# Patient Record
Sex: Female | Born: 1965
Health system: Southern US, Community
[De-identification: ages and names within clinical notes are randomized; demographics above are authoritative.]

## PROBLEM LIST (undated history)

## (undated) DIAGNOSIS — C73 Malignant neoplasm of thyroid gland: Secondary | ICD-10-CM

## (undated) HISTORY — PX: TONSILLECTOMY: SUR1361

## (undated) HISTORY — PX: ABDOMINAL HYSTERECTOMY: SHX81

## (undated) HISTORY — PX: THYROID SURGERY: SHX805

## (undated) HISTORY — PX: CHOLECYSTECTOMY: SHX55

---

## 2016-04-21 DIAGNOSIS — Z6834 Body mass index (BMI) 34.0-34.9, adult: Secondary | ICD-10-CM | POA: Diagnosis not present

## 2016-04-21 DIAGNOSIS — E041 Nontoxic single thyroid nodule: Secondary | ICD-10-CM | POA: Diagnosis not present

## 2016-04-22 DIAGNOSIS — E042 Nontoxic multinodular goiter: Secondary | ICD-10-CM | POA: Diagnosis not present

## 2016-05-02 DIAGNOSIS — E042 Nontoxic multinodular goiter: Secondary | ICD-10-CM | POA: Diagnosis not present

## 2016-05-02 DIAGNOSIS — Z9889 Other specified postprocedural states: Secondary | ICD-10-CM | POA: Diagnosis not present

## 2016-05-03 DIAGNOSIS — S6991XA Unspecified injury of right wrist, hand and finger(s), initial encounter: Secondary | ICD-10-CM | POA: Diagnosis not present

## 2016-05-25 DIAGNOSIS — H66009 Acute suppurative otitis media without spontaneous rupture of ear drum, unspecified ear: Secondary | ICD-10-CM | POA: Diagnosis not present

## 2016-06-06 DIAGNOSIS — J302 Other seasonal allergic rhinitis: Secondary | ICD-10-CM | POA: Diagnosis not present

## 2016-06-06 DIAGNOSIS — J0100 Acute maxillary sinusitis, unspecified: Secondary | ICD-10-CM | POA: Diagnosis not present

## 2016-06-08 DIAGNOSIS — E042 Nontoxic multinodular goiter: Secondary | ICD-10-CM | POA: Diagnosis not present

## 2016-06-30 DIAGNOSIS — M79669 Pain in unspecified lower leg: Secondary | ICD-10-CM | POA: Diagnosis not present

## 2016-06-30 DIAGNOSIS — Z6833 Body mass index (BMI) 33.0-33.9, adult: Secondary | ICD-10-CM | POA: Diagnosis not present

## 2016-07-07 DIAGNOSIS — J0100 Acute maxillary sinusitis, unspecified: Secondary | ICD-10-CM | POA: Diagnosis not present

## 2016-07-14 DIAGNOSIS — R1310 Dysphagia, unspecified: Secondary | ICD-10-CM | POA: Diagnosis not present

## 2016-07-14 DIAGNOSIS — K222 Esophageal obstruction: Secondary | ICD-10-CM | POA: Diagnosis not present

## 2016-07-14 DIAGNOSIS — R12 Heartburn: Secondary | ICD-10-CM | POA: Diagnosis not present

## 2016-07-14 DIAGNOSIS — K209 Esophagitis, unspecified: Secondary | ICD-10-CM | POA: Diagnosis not present

## 2016-07-14 DIAGNOSIS — Z0000 Encounter for general adult medical examination without abnormal findings: Secondary | ICD-10-CM | POA: Diagnosis not present

## 2016-07-24 DIAGNOSIS — Z01419 Encounter for gynecological examination (general) (routine) without abnormal findings: Secondary | ICD-10-CM | POA: Diagnosis not present

## 2016-07-24 DIAGNOSIS — I10 Essential (primary) hypertension: Secondary | ICD-10-CM | POA: Diagnosis not present

## 2016-07-24 DIAGNOSIS — Z6834 Body mass index (BMI) 34.0-34.9, adult: Secondary | ICD-10-CM | POA: Diagnosis not present

## 2016-08-10 DIAGNOSIS — H66009 Acute suppurative otitis media without spontaneous rupture of ear drum, unspecified ear: Secondary | ICD-10-CM | POA: Diagnosis not present

## 2016-08-31 DIAGNOSIS — Z1231 Encounter for screening mammogram for malignant neoplasm of breast: Secondary | ICD-10-CM | POA: Diagnosis not present

## 2016-09-06 DIAGNOSIS — J0100 Acute maxillary sinusitis, unspecified: Secondary | ICD-10-CM | POA: Diagnosis not present

## 2016-09-12 DIAGNOSIS — Z9109 Other allergy status, other than to drugs and biological substances: Secondary | ICD-10-CM | POA: Diagnosis not present

## 2016-10-09 DIAGNOSIS — J343 Hypertrophy of nasal turbinates: Secondary | ICD-10-CM | POA: Diagnosis not present

## 2016-10-09 DIAGNOSIS — E049 Nontoxic goiter, unspecified: Secondary | ICD-10-CM | POA: Diagnosis not present

## 2016-10-09 DIAGNOSIS — J342 Deviated nasal septum: Secondary | ICD-10-CM | POA: Diagnosis not present

## 2016-10-09 DIAGNOSIS — J329 Chronic sinusitis, unspecified: Secondary | ICD-10-CM | POA: Diagnosis not present

## 2016-10-10 DIAGNOSIS — E049 Nontoxic goiter, unspecified: Secondary | ICD-10-CM | POA: Diagnosis not present

## 2016-10-10 DIAGNOSIS — E042 Nontoxic multinodular goiter: Secondary | ICD-10-CM | POA: Diagnosis not present

## 2016-10-17 DIAGNOSIS — Z0000 Encounter for general adult medical examination without abnormal findings: Secondary | ICD-10-CM | POA: Diagnosis not present

## 2016-10-23 DIAGNOSIS — M2550 Pain in unspecified joint: Secondary | ICD-10-CM | POA: Diagnosis not present

## 2016-10-23 DIAGNOSIS — M79604 Pain in right leg: Secondary | ICD-10-CM | POA: Diagnosis not present

## 2016-10-23 DIAGNOSIS — E559 Vitamin D deficiency, unspecified: Secondary | ICD-10-CM | POA: Diagnosis not present

## 2016-10-23 DIAGNOSIS — Z0000 Encounter for general adult medical examination without abnormal findings: Secondary | ICD-10-CM | POA: Diagnosis not present

## 2016-10-23 DIAGNOSIS — M79672 Pain in left foot: Secondary | ICD-10-CM | POA: Diagnosis not present

## 2016-10-23 DIAGNOSIS — Z23 Encounter for immunization: Secondary | ICD-10-CM | POA: Diagnosis not present

## 2016-10-23 DIAGNOSIS — I10 Essential (primary) hypertension: Secondary | ICD-10-CM | POA: Diagnosis not present

## 2016-10-23 DIAGNOSIS — K219 Gastro-esophageal reflux disease without esophagitis: Secondary | ICD-10-CM | POA: Diagnosis not present

## 2016-10-23 DIAGNOSIS — M79605 Pain in left leg: Secondary | ICD-10-CM | POA: Diagnosis not present

## 2016-10-23 DIAGNOSIS — J4520 Mild intermittent asthma, uncomplicated: Secondary | ICD-10-CM | POA: Diagnosis not present

## 2016-10-27 DIAGNOSIS — M79604 Pain in right leg: Secondary | ICD-10-CM | POA: Diagnosis not present

## 2016-10-27 DIAGNOSIS — M79605 Pain in left leg: Secondary | ICD-10-CM | POA: Diagnosis not present

## 2016-10-27 DIAGNOSIS — M79672 Pain in left foot: Secondary | ICD-10-CM | POA: Diagnosis not present

## 2016-11-01 DIAGNOSIS — Z6833 Body mass index (BMI) 33.0-33.9, adult: Secondary | ICD-10-CM | POA: Diagnosis not present

## 2016-11-01 DIAGNOSIS — M19031 Primary osteoarthritis, right wrist: Secondary | ICD-10-CM | POA: Diagnosis not present

## 2016-11-17 DIAGNOSIS — J029 Acute pharyngitis, unspecified: Secondary | ICD-10-CM | POA: Diagnosis not present

## 2016-12-06 DIAGNOSIS — R51 Headache: Secondary | ICD-10-CM | POA: Diagnosis not present

## 2016-12-09 DIAGNOSIS — J329 Chronic sinusitis, unspecified: Secondary | ICD-10-CM | POA: Diagnosis not present

## 2016-12-09 DIAGNOSIS — J988 Other specified respiratory disorders: Secondary | ICD-10-CM | POA: Diagnosis not present

## 2017-01-22 DIAGNOSIS — Z6834 Body mass index (BMI) 34.0-34.9, adult: Secondary | ICD-10-CM | POA: Diagnosis not present

## 2017-01-22 DIAGNOSIS — N764 Abscess of vulva: Secondary | ICD-10-CM | POA: Diagnosis not present

## 2017-03-03 DIAGNOSIS — S93401A Sprain of unspecified ligament of right ankle, initial encounter: Secondary | ICD-10-CM | POA: Diagnosis not present

## 2017-04-11 DIAGNOSIS — E042 Nontoxic multinodular goiter: Secondary | ICD-10-CM | POA: Diagnosis not present

## 2017-04-11 DIAGNOSIS — R5383 Other fatigue: Secondary | ICD-10-CM | POA: Diagnosis not present

## 2017-04-11 DIAGNOSIS — R635 Abnormal weight gain: Secondary | ICD-10-CM | POA: Diagnosis not present

## 2017-04-11 DIAGNOSIS — Z8349 Family history of other endocrine, nutritional and metabolic diseases: Secondary | ICD-10-CM | POA: Diagnosis not present

## 2017-05-08 DIAGNOSIS — L680 Hirsutism: Secondary | ICD-10-CM | POA: Diagnosis not present

## 2017-05-08 DIAGNOSIS — I10 Essential (primary) hypertension: Secondary | ICD-10-CM | POA: Diagnosis not present

## 2017-05-08 DIAGNOSIS — Z8349 Family history of other endocrine, nutritional and metabolic diseases: Secondary | ICD-10-CM | POA: Diagnosis not present

## 2017-05-08 DIAGNOSIS — R635 Abnormal weight gain: Secondary | ICD-10-CM | POA: Diagnosis not present

## 2017-05-08 DIAGNOSIS — E042 Nontoxic multinodular goiter: Secondary | ICD-10-CM | POA: Diagnosis not present

## 2017-05-10 DIAGNOSIS — Z8349 Family history of other endocrine, nutritional and metabolic diseases: Secondary | ICD-10-CM | POA: Diagnosis not present

## 2017-05-10 DIAGNOSIS — E042 Nontoxic multinodular goiter: Secondary | ICD-10-CM | POA: Diagnosis not present

## 2017-05-10 DIAGNOSIS — R635 Abnormal weight gain: Secondary | ICD-10-CM | POA: Diagnosis not present

## 2017-05-10 DIAGNOSIS — R5383 Other fatigue: Secondary | ICD-10-CM | POA: Diagnosis not present

## 2017-05-28 DIAGNOSIS — M79671 Pain in right foot: Secondary | ICD-10-CM | POA: Diagnosis not present

## 2017-05-28 DIAGNOSIS — M25572 Pain in left ankle and joints of left foot: Secondary | ICD-10-CM | POA: Diagnosis not present

## 2017-05-28 DIAGNOSIS — M79672 Pain in left foot: Secondary | ICD-10-CM | POA: Diagnosis not present

## 2017-05-28 DIAGNOSIS — M25571 Pain in right ankle and joints of right foot: Secondary | ICD-10-CM | POA: Diagnosis not present

## 2017-05-28 DIAGNOSIS — S93401A Sprain of unspecified ligament of right ankle, initial encounter: Secondary | ICD-10-CM | POA: Diagnosis not present

## 2017-06-18 DIAGNOSIS — M25571 Pain in right ankle and joints of right foot: Secondary | ICD-10-CM | POA: Diagnosis not present

## 2017-06-18 DIAGNOSIS — R262 Difficulty in walking, not elsewhere classified: Secondary | ICD-10-CM | POA: Diagnosis not present

## 2017-06-18 DIAGNOSIS — Z9181 History of falling: Secondary | ICD-10-CM | POA: Diagnosis not present

## 2017-06-18 DIAGNOSIS — M25572 Pain in left ankle and joints of left foot: Secondary | ICD-10-CM | POA: Diagnosis not present

## 2017-06-19 DIAGNOSIS — S93401A Sprain of unspecified ligament of right ankle, initial encounter: Secondary | ICD-10-CM | POA: Diagnosis not present

## 2017-06-19 DIAGNOSIS — M79673 Pain in unspecified foot: Secondary | ICD-10-CM | POA: Diagnosis not present

## 2017-07-09 DIAGNOSIS — M79671 Pain in right foot: Secondary | ICD-10-CM | POA: Diagnosis not present

## 2017-07-09 DIAGNOSIS — S93401D Sprain of unspecified ligament of right ankle, subsequent encounter: Secondary | ICD-10-CM | POA: Diagnosis not present

## 2017-07-09 DIAGNOSIS — M79672 Pain in left foot: Secondary | ICD-10-CM | POA: Diagnosis not present

## 2017-07-09 DIAGNOSIS — S93402D Sprain of unspecified ligament of left ankle, subsequent encounter: Secondary | ICD-10-CM | POA: Diagnosis not present

## 2017-07-11 DIAGNOSIS — R1011 Right upper quadrant pain: Secondary | ICD-10-CM | POA: Diagnosis not present

## 2017-07-11 DIAGNOSIS — R110 Nausea: Secondary | ICD-10-CM | POA: Diagnosis not present

## 2017-07-12 DIAGNOSIS — K7689 Other specified diseases of liver: Secondary | ICD-10-CM | POA: Diagnosis not present

## 2017-07-12 DIAGNOSIS — R197 Diarrhea, unspecified: Secondary | ICD-10-CM | POA: Diagnosis not present

## 2017-07-12 DIAGNOSIS — K829 Disease of gallbladder, unspecified: Secondary | ICD-10-CM | POA: Diagnosis not present

## 2017-07-12 DIAGNOSIS — R1011 Right upper quadrant pain: Secondary | ICD-10-CM | POA: Diagnosis not present

## 2017-07-18 DIAGNOSIS — K869 Disease of pancreas, unspecified: Secondary | ICD-10-CM | POA: Diagnosis not present

## 2017-07-18 DIAGNOSIS — K769 Liver disease, unspecified: Secondary | ICD-10-CM | POA: Diagnosis not present

## 2017-07-18 DIAGNOSIS — R932 Abnormal findings on diagnostic imaging of liver and biliary tract: Secondary | ICD-10-CM | POA: Diagnosis not present

## 2017-07-31 DIAGNOSIS — S93401A Sprain of unspecified ligament of right ankle, initial encounter: Secondary | ICD-10-CM | POA: Diagnosis not present

## 2017-07-31 DIAGNOSIS — R2241 Localized swelling, mass and lump, right lower limb: Secondary | ICD-10-CM | POA: Diagnosis not present

## 2017-08-23 DIAGNOSIS — R2241 Localized swelling, mass and lump, right lower limb: Secondary | ICD-10-CM | POA: Diagnosis not present

## 2017-08-24 DIAGNOSIS — E041 Nontoxic single thyroid nodule: Secondary | ICD-10-CM | POA: Diagnosis not present

## 2017-08-24 DIAGNOSIS — K219 Gastro-esophageal reflux disease without esophagitis: Secondary | ICD-10-CM | POA: Diagnosis not present

## 2017-08-24 DIAGNOSIS — R1313 Dysphagia, pharyngeal phase: Secondary | ICD-10-CM | POA: Diagnosis not present

## 2017-08-27 DIAGNOSIS — L680 Hirsutism: Secondary | ICD-10-CM | POA: Diagnosis not present

## 2017-08-27 DIAGNOSIS — R300 Dysuria: Secondary | ICD-10-CM | POA: Diagnosis not present

## 2017-08-27 DIAGNOSIS — E669 Obesity, unspecified: Secondary | ICD-10-CM | POA: Diagnosis not present

## 2017-08-27 DIAGNOSIS — Z8349 Family history of other endocrine, nutritional and metabolic diseases: Secondary | ICD-10-CM | POA: Diagnosis not present

## 2017-08-27 DIAGNOSIS — E042 Nontoxic multinodular goiter: Secondary | ICD-10-CM | POA: Diagnosis not present

## 2017-08-28 DIAGNOSIS — E049 Nontoxic goiter, unspecified: Secondary | ICD-10-CM | POA: Diagnosis not present

## 2017-08-28 DIAGNOSIS — E041 Nontoxic single thyroid nodule: Secondary | ICD-10-CM | POA: Diagnosis not present

## 2017-09-11 DIAGNOSIS — R2241 Localized swelling, mass and lump, right lower limb: Secondary | ICD-10-CM | POA: Diagnosis not present

## 2017-09-20 DIAGNOSIS — Z1231 Encounter for screening mammogram for malignant neoplasm of breast: Secondary | ICD-10-CM | POA: Diagnosis not present

## 2017-10-05 DIAGNOSIS — R1313 Dysphagia, pharyngeal phase: Secondary | ICD-10-CM | POA: Diagnosis not present

## 2017-10-05 DIAGNOSIS — K219 Gastro-esophageal reflux disease without esophagitis: Secondary | ICD-10-CM | POA: Diagnosis not present

## 2017-10-05 DIAGNOSIS — E042 Nontoxic multinodular goiter: Secondary | ICD-10-CM | POA: Diagnosis not present

## 2017-10-17 DIAGNOSIS — E041 Nontoxic single thyroid nodule: Secondary | ICD-10-CM | POA: Diagnosis not present

## 2017-10-17 DIAGNOSIS — D1723 Benign lipomatous neoplasm of skin and subcutaneous tissue of right leg: Secondary | ICD-10-CM | POA: Diagnosis not present

## 2017-10-17 DIAGNOSIS — Z79899 Other long term (current) drug therapy: Secondary | ICD-10-CM | POA: Diagnosis not present

## 2017-10-17 DIAGNOSIS — K219 Gastro-esophageal reflux disease without esophagitis: Secondary | ICD-10-CM | POA: Diagnosis not present

## 2017-10-17 DIAGNOSIS — Z881 Allergy status to other antibiotic agents status: Secondary | ICD-10-CM | POA: Diagnosis not present

## 2017-10-17 DIAGNOSIS — R2241 Localized swelling, mass and lump, right lower limb: Secondary | ICD-10-CM | POA: Diagnosis not present

## 2017-10-17 DIAGNOSIS — Z888 Allergy status to other drugs, medicaments and biological substances status: Secondary | ICD-10-CM | POA: Diagnosis not present

## 2017-10-17 DIAGNOSIS — Z91041 Radiographic dye allergy status: Secondary | ICD-10-CM | POA: Diagnosis not present

## 2017-10-17 DIAGNOSIS — Z91048 Other nonmedicinal substance allergy status: Secondary | ICD-10-CM | POA: Diagnosis not present

## 2017-11-06 DIAGNOSIS — R52 Pain, unspecified: Secondary | ICD-10-CM | POA: Diagnosis not present

## 2017-11-06 DIAGNOSIS — R2241 Localized swelling, mass and lump, right lower limb: Secondary | ICD-10-CM | POA: Diagnosis not present

## 2017-11-06 DIAGNOSIS — D1723 Benign lipomatous neoplasm of skin and subcutaneous tissue of right leg: Secondary | ICD-10-CM | POA: Diagnosis not present

## 2017-11-06 DIAGNOSIS — M170 Bilateral primary osteoarthritis of knee: Secondary | ICD-10-CM | POA: Diagnosis not present

## 2017-11-14 DIAGNOSIS — K869 Disease of pancreas, unspecified: Secondary | ICD-10-CM | POA: Diagnosis not present

## 2017-11-14 DIAGNOSIS — M545 Low back pain: Secondary | ICD-10-CM | POA: Diagnosis not present

## 2017-11-14 DIAGNOSIS — R160 Hepatomegaly, not elsewhere classified: Secondary | ICD-10-CM | POA: Diagnosis not present

## 2017-11-14 DIAGNOSIS — I10 Essential (primary) hypertension: Secondary | ICD-10-CM | POA: Diagnosis not present

## 2017-11-25 DIAGNOSIS — J0100 Acute maxillary sinusitis, unspecified: Secondary | ICD-10-CM | POA: Diagnosis not present

## 2017-11-25 DIAGNOSIS — J029 Acute pharyngitis, unspecified: Secondary | ICD-10-CM | POA: Diagnosis not present

## 2017-12-05 DIAGNOSIS — J0100 Acute maxillary sinusitis, unspecified: Secondary | ICD-10-CM | POA: Diagnosis not present

## 2017-12-26 DIAGNOSIS — R51 Headache: Secondary | ICD-10-CM | POA: Diagnosis not present

## 2017-12-29 DIAGNOSIS — R51 Headache: Secondary | ICD-10-CM | POA: Diagnosis not present

## 2017-12-29 DIAGNOSIS — J329 Chronic sinusitis, unspecified: Secondary | ICD-10-CM | POA: Diagnosis not present

## 2018-01-14 DIAGNOSIS — J343 Hypertrophy of nasal turbinates: Secondary | ICD-10-CM | POA: Diagnosis not present

## 2018-02-27 DIAGNOSIS — K7689 Other specified diseases of liver: Secondary | ICD-10-CM | POA: Diagnosis not present

## 2018-02-27 DIAGNOSIS — K769 Liver disease, unspecified: Secondary | ICD-10-CM | POA: Diagnosis not present

## 2018-03-08 DIAGNOSIS — I10 Essential (primary) hypertension: Secondary | ICD-10-CM | POA: Diagnosis not present

## 2018-03-08 DIAGNOSIS — R5383 Other fatigue: Secondary | ICD-10-CM | POA: Diagnosis not present

## 2018-03-08 DIAGNOSIS — Z1331 Encounter for screening for depression: Secondary | ICD-10-CM | POA: Diagnosis not present

## 2018-03-08 DIAGNOSIS — Z833 Family history of diabetes mellitus: Secondary | ICD-10-CM | POA: Diagnosis not present

## 2018-03-08 DIAGNOSIS — Z0000 Encounter for general adult medical examination without abnormal findings: Secondary | ICD-10-CM | POA: Diagnosis not present

## 2018-03-08 DIAGNOSIS — K5900 Constipation, unspecified: Secondary | ICD-10-CM | POA: Diagnosis not present

## 2018-04-04 DIAGNOSIS — E042 Nontoxic multinodular goiter: Secondary | ICD-10-CM | POA: Diagnosis not present

## 2018-04-04 DIAGNOSIS — K219 Gastro-esophageal reflux disease without esophagitis: Secondary | ICD-10-CM | POA: Diagnosis not present

## 2018-04-26 DIAGNOSIS — L821 Other seborrheic keratosis: Secondary | ICD-10-CM | POA: Diagnosis not present

## 2018-04-26 DIAGNOSIS — L578 Other skin changes due to chronic exposure to nonionizing radiation: Secondary | ICD-10-CM | POA: Diagnosis not present

## 2018-05-13 DIAGNOSIS — Z9079 Acquired absence of other genital organ(s): Secondary | ICD-10-CM | POA: Diagnosis not present

## 2018-05-13 DIAGNOSIS — R350 Frequency of micturition: Secondary | ICD-10-CM | POA: Diagnosis not present

## 2018-05-13 DIAGNOSIS — R1031 Right lower quadrant pain: Secondary | ICD-10-CM | POA: Diagnosis not present

## 2018-05-13 DIAGNOSIS — Z124 Encounter for screening for malignant neoplasm of cervix: Secondary | ICD-10-CM | POA: Diagnosis not present

## 2018-05-13 DIAGNOSIS — N952 Postmenopausal atrophic vaginitis: Secondary | ICD-10-CM | POA: Diagnosis not present

## 2018-05-13 DIAGNOSIS — Z90710 Acquired absence of both cervix and uterus: Secondary | ICD-10-CM | POA: Diagnosis not present

## 2018-05-13 DIAGNOSIS — R3915 Urgency of urination: Secondary | ICD-10-CM | POA: Diagnosis not present

## 2018-05-13 DIAGNOSIS — Z90722 Acquired absence of ovaries, bilateral: Secondary | ICD-10-CM | POA: Diagnosis not present

## 2018-05-13 DIAGNOSIS — R102 Pelvic and perineal pain: Secondary | ICD-10-CM | POA: Diagnosis not present

## 2018-06-21 DIAGNOSIS — E559 Vitamin D deficiency, unspecified: Secondary | ICD-10-CM | POA: Diagnosis not present

## 2018-06-21 DIAGNOSIS — R1011 Right upper quadrant pain: Secondary | ICD-10-CM | POA: Diagnosis not present

## 2018-06-21 DIAGNOSIS — R7303 Prediabetes: Secondary | ICD-10-CM | POA: Diagnosis not present

## 2018-07-02 DIAGNOSIS — R1011 Right upper quadrant pain: Secondary | ICD-10-CM | POA: Diagnosis not present

## 2018-07-02 DIAGNOSIS — K7689 Other specified diseases of liver: Secondary | ICD-10-CM | POA: Diagnosis not present

## 2018-07-09 DIAGNOSIS — Z6833 Body mass index (BMI) 33.0-33.9, adult: Secondary | ICD-10-CM | POA: Diagnosis not present

## 2018-07-09 DIAGNOSIS — R1011 Right upper quadrant pain: Secondary | ICD-10-CM | POA: Diagnosis not present

## 2018-07-09 DIAGNOSIS — K838 Other specified diseases of biliary tract: Secondary | ICD-10-CM | POA: Diagnosis not present

## 2018-07-09 DIAGNOSIS — K811 Chronic cholecystitis: Secondary | ICD-10-CM | POA: Diagnosis not present

## 2018-07-24 DIAGNOSIS — J45909 Unspecified asthma, uncomplicated: Secondary | ICD-10-CM | POA: Diagnosis not present

## 2018-07-24 DIAGNOSIS — I10 Essential (primary) hypertension: Secondary | ICD-10-CM | POA: Diagnosis not present

## 2018-07-24 DIAGNOSIS — K219 Gastro-esophageal reflux disease without esophagitis: Secondary | ICD-10-CM | POA: Diagnosis not present

## 2018-07-24 DIAGNOSIS — K811 Chronic cholecystitis: Secondary | ICD-10-CM | POA: Diagnosis not present

## 2018-07-24 DIAGNOSIS — Z79899 Other long term (current) drug therapy: Secondary | ICD-10-CM | POA: Diagnosis not present

## 2018-07-24 DIAGNOSIS — K8010 Calculus of gallbladder with chronic cholecystitis without obstruction: Secondary | ICD-10-CM | POA: Diagnosis not present

## 2018-07-24 DIAGNOSIS — K8012 Calculus of gallbladder with acute and chronic cholecystitis without obstruction: Secondary | ICD-10-CM | POA: Diagnosis not present

## 2018-08-07 DIAGNOSIS — I10 Essential (primary) hypertension: Secondary | ICD-10-CM | POA: Diagnosis not present

## 2018-08-07 DIAGNOSIS — R079 Chest pain, unspecified: Secondary | ICD-10-CM | POA: Diagnosis not present

## 2018-10-10 DIAGNOSIS — Z1231 Encounter for screening mammogram for malignant neoplasm of breast: Secondary | ICD-10-CM | POA: Diagnosis not present

## 2018-10-10 DIAGNOSIS — Z1239 Encounter for other screening for malignant neoplasm of breast: Secondary | ICD-10-CM | POA: Diagnosis not present

## 2018-11-22 DIAGNOSIS — J019 Acute sinusitis, unspecified: Secondary | ICD-10-CM | POA: Diagnosis not present

## 2018-11-22 DIAGNOSIS — R52 Pain, unspecified: Secondary | ICD-10-CM | POA: Diagnosis not present

## 2018-12-30 DIAGNOSIS — L728 Other follicular cysts of the skin and subcutaneous tissue: Secondary | ICD-10-CM | POA: Diagnosis not present

## 2018-12-30 DIAGNOSIS — L570 Actinic keratosis: Secondary | ICD-10-CM | POA: Diagnosis not present

## 2019-01-03 DIAGNOSIS — R5383 Other fatigue: Secondary | ICD-10-CM | POA: Diagnosis not present

## 2019-01-03 DIAGNOSIS — E559 Vitamin D deficiency, unspecified: Secondary | ICD-10-CM | POA: Diagnosis not present

## 2019-01-03 DIAGNOSIS — J029 Acute pharyngitis, unspecified: Secondary | ICD-10-CM | POA: Diagnosis not present

## 2019-01-03 DIAGNOSIS — I10 Essential (primary) hypertension: Secondary | ICD-10-CM | POA: Diagnosis not present

## 2019-01-03 DIAGNOSIS — E785 Hyperlipidemia, unspecified: Secondary | ICD-10-CM | POA: Diagnosis not present

## 2019-01-03 DIAGNOSIS — R7303 Prediabetes: Secondary | ICD-10-CM | POA: Diagnosis not present

## 2019-01-07 DIAGNOSIS — E042 Nontoxic multinodular goiter: Secondary | ICD-10-CM | POA: Diagnosis not present

## 2019-01-20 DIAGNOSIS — E042 Nontoxic multinodular goiter: Secondary | ICD-10-CM | POA: Diagnosis not present

## 2019-07-28 DIAGNOSIS — E042 Nontoxic multinodular goiter: Secondary | ICD-10-CM | POA: Diagnosis not present

## 2019-08-08 DIAGNOSIS — K862 Cyst of pancreas: Secondary | ICD-10-CM | POA: Diagnosis not present

## 2019-08-08 DIAGNOSIS — K7689 Other specified diseases of liver: Secondary | ICD-10-CM | POA: Diagnosis not present

## 2019-08-08 DIAGNOSIS — K769 Liver disease, unspecified: Secondary | ICD-10-CM | POA: Diagnosis not present

## 2019-08-26 DIAGNOSIS — E042 Nontoxic multinodular goiter: Secondary | ICD-10-CM | POA: Diagnosis not present

## 2019-09-02 DIAGNOSIS — Z20828 Contact with and (suspected) exposure to other viral communicable diseases: Secondary | ICD-10-CM | POA: Diagnosis not present

## 2019-09-09 DIAGNOSIS — Z79899 Other long term (current) drug therapy: Secondary | ICD-10-CM | POA: Diagnosis not present

## 2019-09-09 DIAGNOSIS — Z1331 Encounter for screening for depression: Secondary | ICD-10-CM | POA: Diagnosis not present

## 2019-09-09 DIAGNOSIS — I10 Essential (primary) hypertension: Secondary | ICD-10-CM | POA: Diagnosis not present

## 2019-09-09 DIAGNOSIS — E042 Nontoxic multinodular goiter: Secondary | ICD-10-CM | POA: Diagnosis not present

## 2019-09-09 DIAGNOSIS — E785 Hyperlipidemia, unspecified: Secondary | ICD-10-CM | POA: Diagnosis not present

## 2019-09-09 DIAGNOSIS — R7303 Prediabetes: Secondary | ICD-10-CM | POA: Diagnosis not present

## 2019-09-09 DIAGNOSIS — E559 Vitamin D deficiency, unspecified: Secondary | ICD-10-CM | POA: Diagnosis not present

## 2019-09-10 DIAGNOSIS — E042 Nontoxic multinodular goiter: Secondary | ICD-10-CM | POA: Diagnosis not present

## 2019-09-28 DIAGNOSIS — Z01818 Encounter for other preprocedural examination: Secondary | ICD-10-CM | POA: Diagnosis not present

## 2019-10-01 DIAGNOSIS — Z833 Family history of diabetes mellitus: Secondary | ICD-10-CM | POA: Diagnosis not present

## 2019-10-01 DIAGNOSIS — E042 Nontoxic multinodular goiter: Secondary | ICD-10-CM | POA: Diagnosis not present

## 2019-10-01 DIAGNOSIS — K219 Gastro-esophageal reflux disease without esophagitis: Secondary | ICD-10-CM | POA: Diagnosis not present

## 2019-10-01 DIAGNOSIS — I10 Essential (primary) hypertension: Secondary | ICD-10-CM | POA: Diagnosis not present

## 2019-10-01 DIAGNOSIS — Z6830 Body mass index (BMI) 30.0-30.9, adult: Secondary | ICD-10-CM | POA: Diagnosis not present

## 2019-10-01 DIAGNOSIS — J45909 Unspecified asthma, uncomplicated: Secondary | ICD-10-CM | POA: Diagnosis not present

## 2019-10-01 DIAGNOSIS — Z888 Allergy status to other drugs, medicaments and biological substances status: Secondary | ICD-10-CM | POA: Diagnosis not present

## 2019-10-01 DIAGNOSIS — G43909 Migraine, unspecified, not intractable, without status migrainosus: Secondary | ICD-10-CM | POA: Diagnosis not present

## 2019-10-01 DIAGNOSIS — Z91041 Radiographic dye allergy status: Secondary | ICD-10-CM | POA: Diagnosis not present

## 2019-10-01 DIAGNOSIS — C73 Malignant neoplasm of thyroid gland: Secondary | ICD-10-CM | POA: Diagnosis not present

## 2019-10-01 DIAGNOSIS — Z79899 Other long term (current) drug therapy: Secondary | ICD-10-CM | POA: Diagnosis not present

## 2019-10-01 DIAGNOSIS — Z8249 Family history of ischemic heart disease and other diseases of the circulatory system: Secondary | ICD-10-CM | POA: Diagnosis not present

## 2019-10-01 DIAGNOSIS — Z881 Allergy status to other antibiotic agents status: Secondary | ICD-10-CM | POA: Diagnosis not present

## 2019-10-01 DIAGNOSIS — E669 Obesity, unspecified: Secondary | ICD-10-CM | POA: Diagnosis not present

## 2019-10-01 DIAGNOSIS — Z91030 Bee allergy status: Secondary | ICD-10-CM | POA: Diagnosis not present

## 2019-10-15 DIAGNOSIS — M545 Low back pain: Secondary | ICD-10-CM | POA: Diagnosis not present

## 2019-10-15 DIAGNOSIS — R0602 Shortness of breath: Secondary | ICD-10-CM | POA: Diagnosis not present

## 2019-10-15 DIAGNOSIS — R05 Cough: Secondary | ICD-10-CM | POA: Diagnosis not present

## 2019-10-24 DIAGNOSIS — I10 Essential (primary) hypertension: Secondary | ICD-10-CM | POA: Diagnosis not present

## 2019-10-24 DIAGNOSIS — R002 Palpitations: Secondary | ICD-10-CM | POA: Diagnosis not present

## 2019-10-24 DIAGNOSIS — C73 Malignant neoplasm of thyroid gland: Secondary | ICD-10-CM | POA: Diagnosis not present

## 2019-10-27 ENCOUNTER — Other Ambulatory Visit: Payer: Self-pay

## 2019-10-27 ENCOUNTER — Emergency Department (HOSPITAL_BASED_OUTPATIENT_CLINIC_OR_DEPARTMENT_OTHER): Payer: BC Managed Care – PPO

## 2019-10-27 ENCOUNTER — Emergency Department (HOSPITAL_BASED_OUTPATIENT_CLINIC_OR_DEPARTMENT_OTHER)
Admission: EM | Admit: 2019-10-27 | Discharge: 2019-10-27 | Disposition: A | Discharge status: Home / Self Care | Payer: BC Managed Care – PPO | Attending: Emergency Medicine | Admitting: Emergency Medicine

## 2019-10-27 ENCOUNTER — Encounter (HOSPITAL_BASED_OUTPATIENT_CLINIC_OR_DEPARTMENT_OTHER): Payer: Self-pay

## 2019-10-27 DIAGNOSIS — Z5321 Procedure and treatment not carried out due to patient leaving prior to being seen by health care provider: Secondary | ICD-10-CM | POA: Diagnosis not present

## 2019-10-27 DIAGNOSIS — M545 Low back pain: Secondary | ICD-10-CM | POA: Diagnosis not present

## 2019-10-27 DIAGNOSIS — M79605 Pain in left leg: Secondary | ICD-10-CM | POA: Diagnosis not present

## 2019-10-27 DIAGNOSIS — M79662 Pain in left lower leg: Secondary | ICD-10-CM | POA: Diagnosis not present

## 2019-10-27 DIAGNOSIS — M25552 Pain in left hip: Secondary | ICD-10-CM | POA: Diagnosis not present

## 2019-10-27 HISTORY — DX: Malignant neoplasm of thyroid gland: C73

## 2019-10-27 NOTE — ED Triage Notes (Signed)
Pt c/o pain to left calf and left hip day 3-denies injury-sent from [facility] to r/o DVT-pt with recent thyroid surgery last month

## 2019-10-27 NOTE — ED Notes (Signed)
While revitaling , pt states she is leaving and will f/u with her PMD tommorw

## 2019-10-28 DIAGNOSIS — M545 Low back pain: Secondary | ICD-10-CM | POA: Diagnosis not present

## 2019-10-29 DIAGNOSIS — M545 Low back pain: Secondary | ICD-10-CM | POA: Diagnosis not present

## 2019-11-05 DIAGNOSIS — R002 Palpitations: Secondary | ICD-10-CM | POA: Diagnosis not present

## 2019-11-05 DIAGNOSIS — E7800 Pure hypercholesterolemia, unspecified: Secondary | ICD-10-CM | POA: Diagnosis not present

## 2019-11-05 DIAGNOSIS — I10 Essential (primary) hypertension: Secondary | ICD-10-CM | POA: Diagnosis not present

## 2019-11-05 DIAGNOSIS — Z09 Encounter for follow-up examination after completed treatment for conditions other than malignant neoplasm: Secondary | ICD-10-CM | POA: Diagnosis not present

## 2019-11-14 DIAGNOSIS — Z23 Encounter for immunization: Secondary | ICD-10-CM | POA: Diagnosis not present

## 2019-12-11 DIAGNOSIS — E890 Postprocedural hypothyroidism: Secondary | ICD-10-CM | POA: Diagnosis not present

## 2019-12-17 DIAGNOSIS — Z1231 Encounter for screening mammogram for malignant neoplasm of breast: Secondary | ICD-10-CM | POA: Diagnosis not present

## 2019-12-17 DIAGNOSIS — Z1239 Encounter for other screening for malignant neoplasm of breast: Secondary | ICD-10-CM | POA: Diagnosis not present

## 2020-01-19 DIAGNOSIS — C73 Malignant neoplasm of thyroid gland: Secondary | ICD-10-CM | POA: Diagnosis not present

## 2020-01-19 DIAGNOSIS — E890 Postprocedural hypothyroidism: Secondary | ICD-10-CM | POA: Diagnosis not present

## 2020-01-28 IMAGING — US US EXTREM LOW VENOUS*L*
1 series · 13 of 24 positions shown · non-contrast
Comparison: None.

CLINICAL DATA: Left leg pain



[Series 1: us extrem low venous*left* · 13 of 33 slices shown]
[im 1/33]
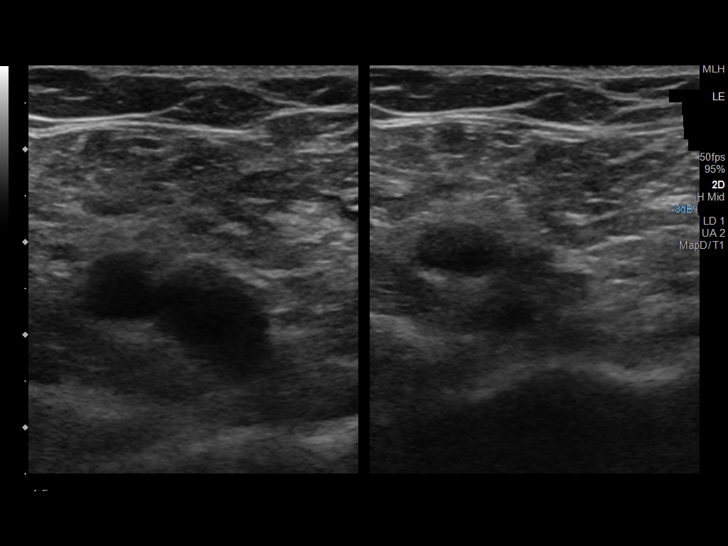
[im 3/33]
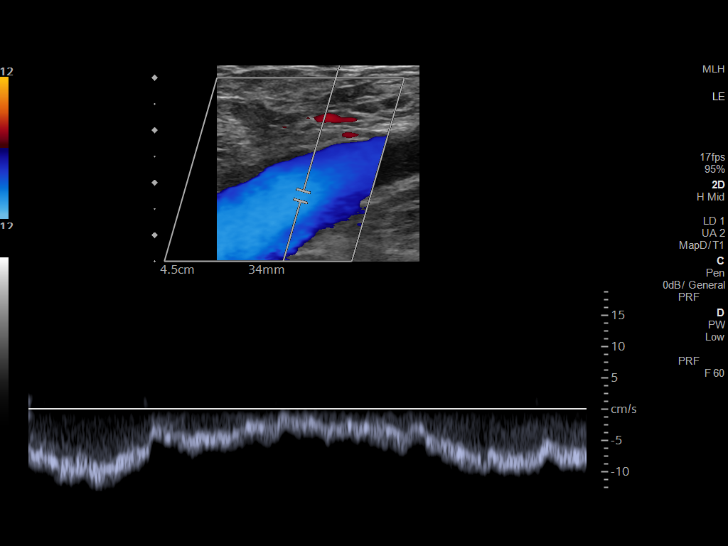
[im 6/33]
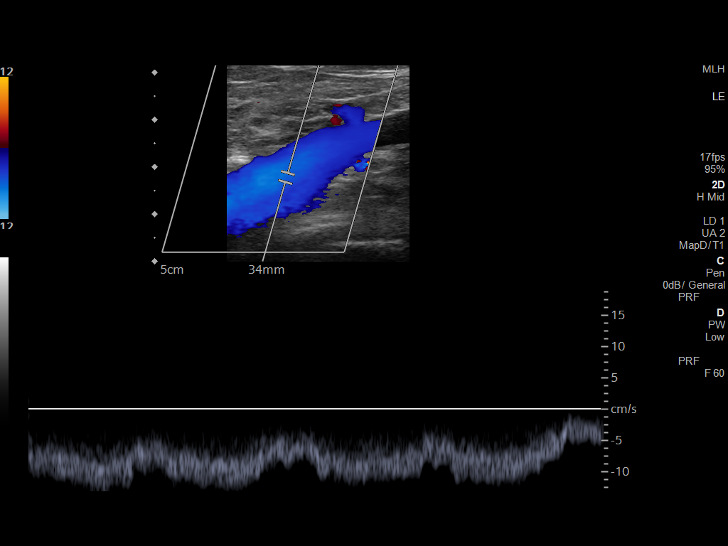
[im 9/33]
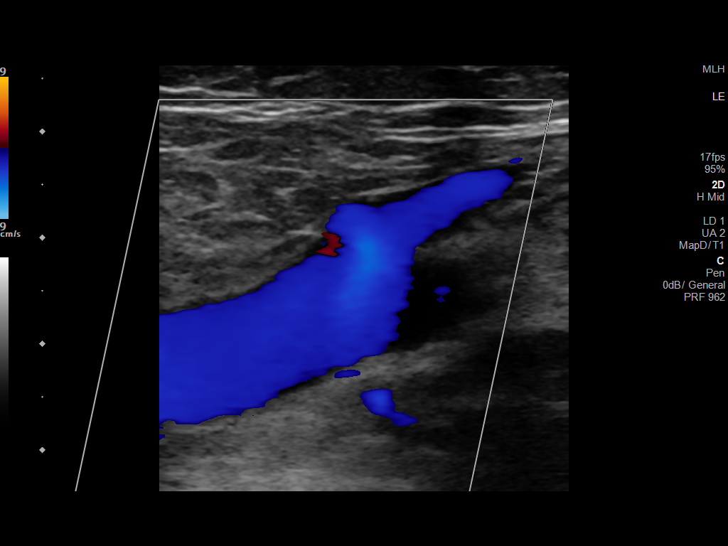
[im 12/33]
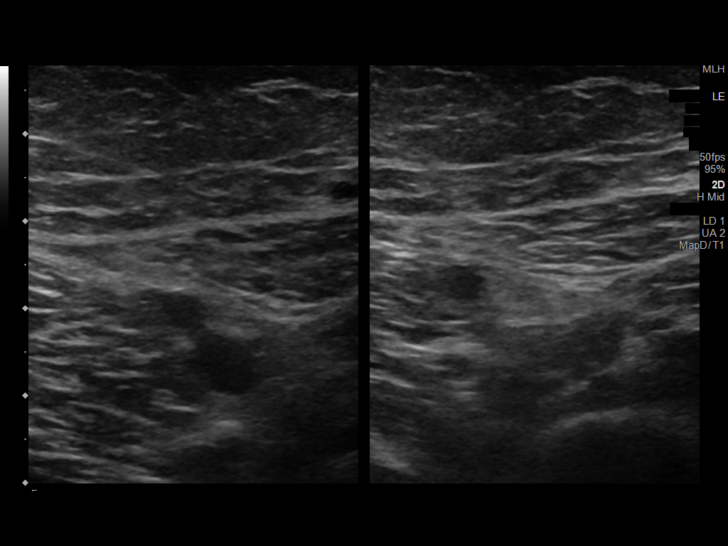
[im 14/33]
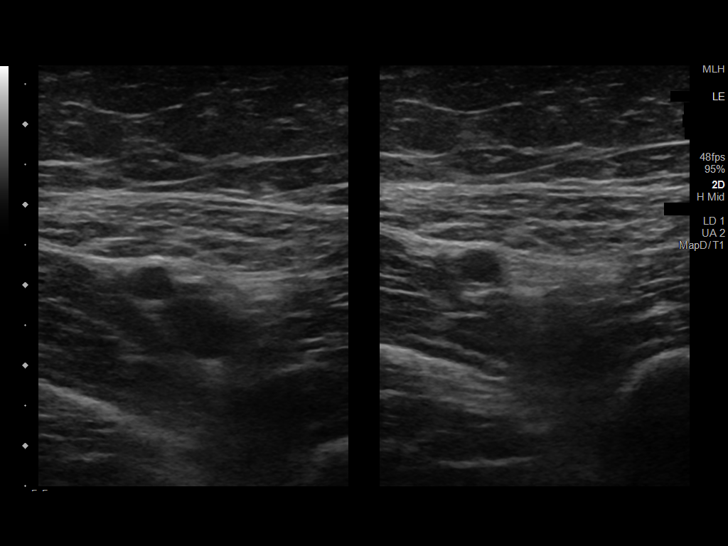
[im 17/33]
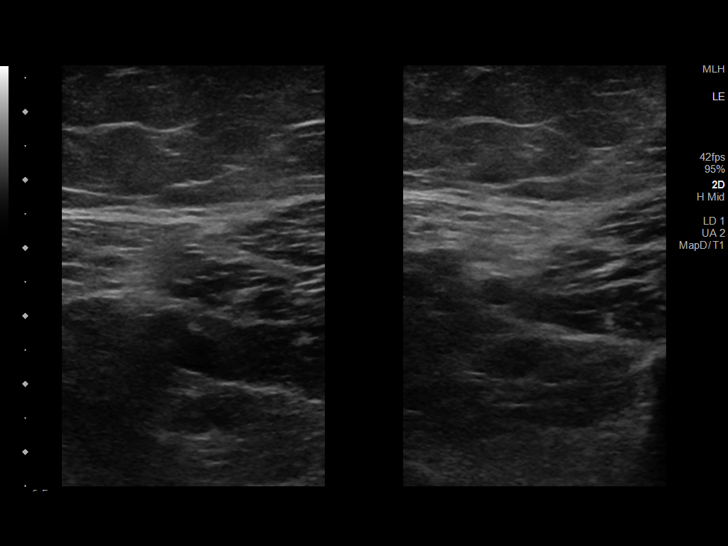
[im 19/33]
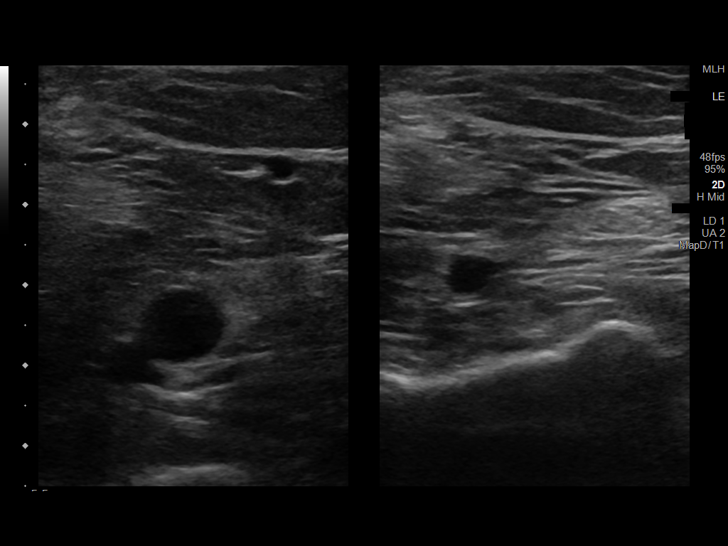
[im 21/33]
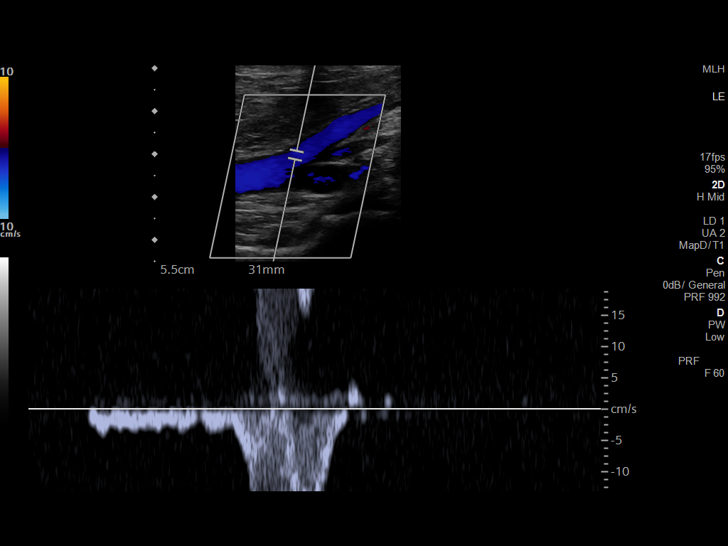
[im 24/33]
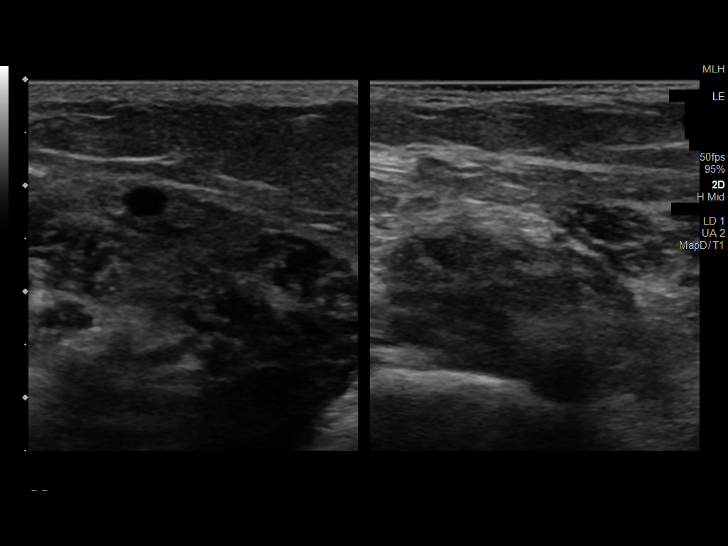
[im 27/33]
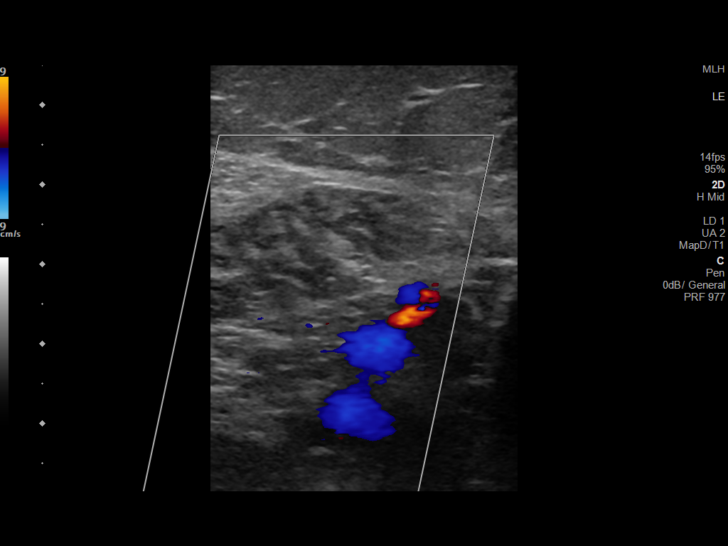
[im 30/33]
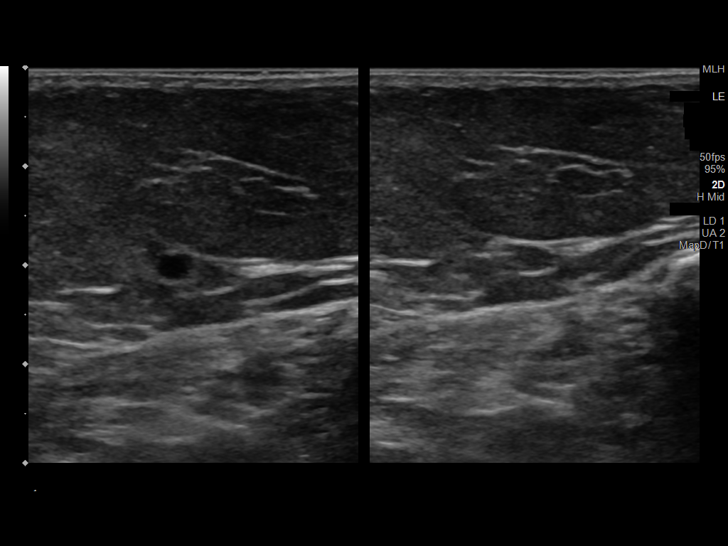
[im 33/33]
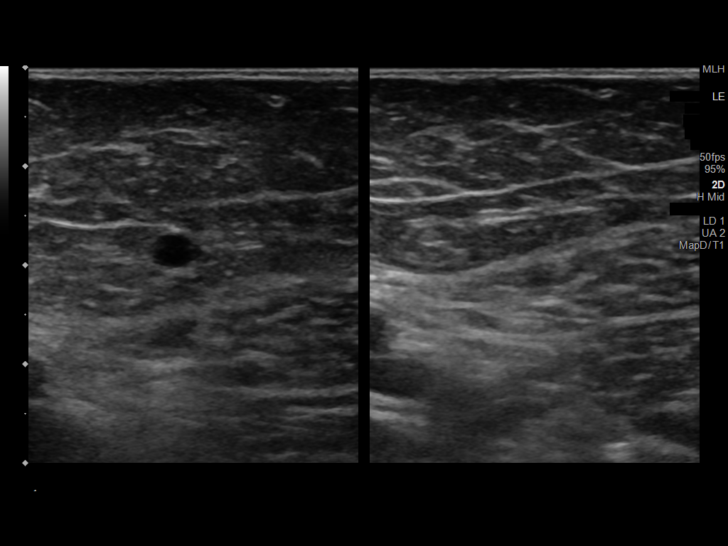

[13 of 24 positions shown; findings below may reference images not displayed]

FINDINGS: Contralateral Common Femoral Vein: Respiratory phasicity is normal
and symmetric with the symptomatic side. No evidence of thrombus.
Normal compressibility.

Common Femoral Vein: No evidence of thrombus. Normal
compressibility, respiratory phasicity and response to augmentation.

Saphenofemoral Junction: No evidence of thrombus. Normal
compressibility and flow on color Doppler imaging.

Profunda Femoral Vein: No evidence of thrombus. Normal
compressibility and flow on color Doppler imaging.

Femoral Vein: No evidence of thrombus. Normal compressibility,
respiratory phasicity and response to augmentation.

Popliteal Vein: No evidence of thrombus. Normal compressibility,
respiratory phasicity and response to augmentation.

Calf Veins: No evidence of thrombus. Normal compressibility and flow
on color Doppler imaging.

Superficial Great Saphenous Vein: No evidence of thrombus. Normal
compressibility.

Venous Reflux:  None.

Other Findings:  None.
IMPRESSION: No evidence of deep venous thrombosis.

## 2020-03-01 DIAGNOSIS — E890 Postprocedural hypothyroidism: Secondary | ICD-10-CM | POA: Diagnosis not present

## 2020-03-17 DIAGNOSIS — E785 Hyperlipidemia, unspecified: Secondary | ICD-10-CM | POA: Diagnosis not present

## 2020-03-17 DIAGNOSIS — Z85850 Personal history of malignant neoplasm of thyroid: Secondary | ICD-10-CM | POA: Diagnosis not present

## 2020-03-17 DIAGNOSIS — I10 Essential (primary) hypertension: Secondary | ICD-10-CM | POA: Diagnosis not present

## 2020-03-17 DIAGNOSIS — R7303 Prediabetes: Secondary | ICD-10-CM | POA: Diagnosis not present

## 2020-04-01 DIAGNOSIS — Z20822 Contact with and (suspected) exposure to covid-19: Secondary | ICD-10-CM | POA: Diagnosis not present

## 2020-04-19 DIAGNOSIS — C73 Malignant neoplasm of thyroid gland: Secondary | ICD-10-CM | POA: Diagnosis not present

## 2020-04-19 DIAGNOSIS — E890 Postprocedural hypothyroidism: Secondary | ICD-10-CM | POA: Diagnosis not present

## 2020-04-21 DIAGNOSIS — R1310 Dysphagia, unspecified: Secondary | ICD-10-CM | POA: Diagnosis not present

## 2020-04-21 DIAGNOSIS — K219 Gastro-esophageal reflux disease without esophagitis: Secondary | ICD-10-CM | POA: Diagnosis not present

## 2020-04-21 DIAGNOSIS — C73 Malignant neoplasm of thyroid gland: Secondary | ICD-10-CM | POA: Diagnosis not present

## 2020-05-05 DIAGNOSIS — D485 Neoplasm of uncertain behavior of skin: Secondary | ICD-10-CM | POA: Diagnosis not present

## 2020-05-05 DIAGNOSIS — C44319 Basal cell carcinoma of skin of other parts of face: Secondary | ICD-10-CM | POA: Diagnosis not present

## 2020-05-12 DIAGNOSIS — C73 Malignant neoplasm of thyroid gland: Secondary | ICD-10-CM | POA: Diagnosis not present

## 2020-05-26 DIAGNOSIS — L570 Actinic keratosis: Secondary | ICD-10-CM | POA: Diagnosis not present

## 2020-06-11 DIAGNOSIS — E890 Postprocedural hypothyroidism: Secondary | ICD-10-CM | POA: Diagnosis not present

## 2020-06-11 DIAGNOSIS — E042 Nontoxic multinodular goiter: Secondary | ICD-10-CM | POA: Diagnosis not present

## 2020-06-11 DIAGNOSIS — C73 Malignant neoplasm of thyroid gland: Secondary | ICD-10-CM | POA: Diagnosis not present

## 2020-06-29 DIAGNOSIS — R079 Chest pain, unspecified: Secondary | ICD-10-CM | POA: Diagnosis not present

## 2020-06-29 DIAGNOSIS — R0789 Other chest pain: Secondary | ICD-10-CM | POA: Diagnosis not present

## 2020-06-29 DIAGNOSIS — N644 Mastodynia: Secondary | ICD-10-CM | POA: Diagnosis not present

## 2020-06-30 DIAGNOSIS — M545 Low back pain: Secondary | ICD-10-CM | POA: Diagnosis not present

## 2020-06-30 DIAGNOSIS — M546 Pain in thoracic spine: Secondary | ICD-10-CM | POA: Diagnosis not present

## 2020-07-01 DIAGNOSIS — M545 Low back pain: Secondary | ICD-10-CM | POA: Diagnosis not present

## 2020-07-01 DIAGNOSIS — M546 Pain in thoracic spine: Secondary | ICD-10-CM | POA: Diagnosis not present

## 2020-07-02 DIAGNOSIS — M545 Low back pain: Secondary | ICD-10-CM | POA: Diagnosis not present

## 2020-07-02 DIAGNOSIS — M546 Pain in thoracic spine: Secondary | ICD-10-CM | POA: Diagnosis not present

## 2020-07-09 DIAGNOSIS — N644 Mastodynia: Secondary | ICD-10-CM | POA: Diagnosis not present

## 2020-07-09 DIAGNOSIS — N6489 Other specified disorders of breast: Secondary | ICD-10-CM | POA: Diagnosis not present

## 2020-07-09 DIAGNOSIS — R928 Other abnormal and inconclusive findings on diagnostic imaging of breast: Secondary | ICD-10-CM | POA: Diagnosis not present

## 2020-07-29 DIAGNOSIS — M542 Cervicalgia: Secondary | ICD-10-CM | POA: Diagnosis not present

## 2020-07-29 DIAGNOSIS — M25511 Pain in right shoulder: Secondary | ICD-10-CM | POA: Diagnosis not present

## 2020-07-29 DIAGNOSIS — M5412 Radiculopathy, cervical region: Secondary | ICD-10-CM | POA: Diagnosis not present

## 2020-08-17 DIAGNOSIS — M5412 Radiculopathy, cervical region: Secondary | ICD-10-CM | POA: Diagnosis not present

## 2020-08-23 DIAGNOSIS — M25511 Pain in right shoulder: Secondary | ICD-10-CM | POA: Diagnosis not present

## 2020-08-23 DIAGNOSIS — M5412 Radiculopathy, cervical region: Secondary | ICD-10-CM | POA: Diagnosis not present

## 2020-08-23 DIAGNOSIS — M542 Cervicalgia: Secondary | ICD-10-CM | POA: Diagnosis not present

## 2020-08-27 DIAGNOSIS — M5412 Radiculopathy, cervical region: Secondary | ICD-10-CM | POA: Diagnosis not present

## 2020-08-27 DIAGNOSIS — M542 Cervicalgia: Secondary | ICD-10-CM | POA: Diagnosis not present

## 2020-08-27 DIAGNOSIS — D1801 Hemangioma of skin and subcutaneous tissue: Secondary | ICD-10-CM | POA: Diagnosis not present

## 2020-08-27 DIAGNOSIS — M25511 Pain in right shoulder: Secondary | ICD-10-CM | POA: Diagnosis not present

## 2020-08-27 DIAGNOSIS — L578 Other skin changes due to chronic exposure to nonionizing radiation: Secondary | ICD-10-CM | POA: Diagnosis not present

## 2020-09-15 DIAGNOSIS — R42 Dizziness and giddiness: Secondary | ICD-10-CM | POA: Diagnosis not present

## 2020-09-15 DIAGNOSIS — Z20822 Contact with and (suspected) exposure to covid-19: Secondary | ICD-10-CM | POA: Diagnosis not present

## 2020-09-15 DIAGNOSIS — R519 Headache, unspecified: Secondary | ICD-10-CM | POA: Diagnosis not present

## 2020-09-15 DIAGNOSIS — R0981 Nasal congestion: Secondary | ICD-10-CM | POA: Diagnosis not present

## 2020-09-20 DIAGNOSIS — J019 Acute sinusitis, unspecified: Secondary | ICD-10-CM | POA: Diagnosis not present

## 2020-09-20 DIAGNOSIS — B9689 Other specified bacterial agents as the cause of diseases classified elsewhere: Secondary | ICD-10-CM | POA: Diagnosis not present

## 2020-09-24 DIAGNOSIS — Z85850 Personal history of malignant neoplasm of thyroid: Secondary | ICD-10-CM | POA: Diagnosis not present

## 2020-09-24 DIAGNOSIS — K862 Cyst of pancreas: Secondary | ICD-10-CM | POA: Diagnosis not present

## 2020-09-24 DIAGNOSIS — K429 Umbilical hernia without obstruction or gangrene: Secondary | ICD-10-CM | POA: Diagnosis not present

## 2020-09-24 DIAGNOSIS — K769 Liver disease, unspecified: Secondary | ICD-10-CM | POA: Diagnosis not present

## 2020-09-24 DIAGNOSIS — R7303 Prediabetes: Secondary | ICD-10-CM | POA: Diagnosis not present

## 2020-09-24 DIAGNOSIS — I10 Essential (primary) hypertension: Secondary | ICD-10-CM | POA: Diagnosis not present

## 2020-09-24 DIAGNOSIS — Z1331 Encounter for screening for depression: Secondary | ICD-10-CM | POA: Diagnosis not present

## 2020-09-24 DIAGNOSIS — K7689 Other specified diseases of liver: Secondary | ICD-10-CM | POA: Diagnosis not present

## 2020-09-24 DIAGNOSIS — E559 Vitamin D deficiency, unspecified: Secondary | ICD-10-CM | POA: Diagnosis not present

## 2020-09-24 DIAGNOSIS — K8689 Other specified diseases of pancreas: Secondary | ICD-10-CM | POA: Diagnosis not present

## 2020-09-24 DIAGNOSIS — E785 Hyperlipidemia, unspecified: Secondary | ICD-10-CM | POA: Diagnosis not present

## 2020-10-04 DIAGNOSIS — Z6832 Body mass index (BMI) 32.0-32.9, adult: Secondary | ICD-10-CM | POA: Diagnosis not present

## 2020-10-04 DIAGNOSIS — Z6831 Body mass index (BMI) 31.0-31.9, adult: Secondary | ICD-10-CM | POA: Diagnosis not present

## 2020-10-04 DIAGNOSIS — R351 Nocturia: Secondary | ICD-10-CM | POA: Diagnosis not present

## 2020-10-04 DIAGNOSIS — N390 Urinary tract infection, site not specified: Secondary | ICD-10-CM | POA: Diagnosis not present

## 2020-10-25 DIAGNOSIS — J029 Acute pharyngitis, unspecified: Secondary | ICD-10-CM | POA: Diagnosis not present

## 2020-10-29 DIAGNOSIS — C73 Malignant neoplasm of thyroid gland: Secondary | ICD-10-CM | POA: Diagnosis not present

## 2020-10-29 DIAGNOSIS — E890 Postprocedural hypothyroidism: Secondary | ICD-10-CM | POA: Diagnosis not present

## 2020-11-03 DIAGNOSIS — N952 Postmenopausal atrophic vaginitis: Secondary | ICD-10-CM | POA: Diagnosis not present

## 2020-11-03 DIAGNOSIS — R339 Retention of urine, unspecified: Secondary | ICD-10-CM | POA: Diagnosis not present

## 2020-11-03 DIAGNOSIS — Z79899 Other long term (current) drug therapy: Secondary | ICD-10-CM | POA: Diagnosis not present

## 2020-11-03 DIAGNOSIS — N390 Urinary tract infection, site not specified: Secondary | ICD-10-CM | POA: Diagnosis not present

## 2020-12-10 DIAGNOSIS — R339 Retention of urine, unspecified: Secondary | ICD-10-CM | POA: Diagnosis not present

## 2020-12-11 DIAGNOSIS — J029 Acute pharyngitis, unspecified: Secondary | ICD-10-CM | POA: Diagnosis not present

## 2020-12-11 DIAGNOSIS — R5383 Other fatigue: Secondary | ICD-10-CM | POA: Diagnosis not present

## 2020-12-11 DIAGNOSIS — R059 Cough, unspecified: Secondary | ICD-10-CM | POA: Diagnosis not present

## 2020-12-11 DIAGNOSIS — U071 COVID-19: Secondary | ICD-10-CM | POA: Diagnosis not present

## 2020-12-11 DIAGNOSIS — R0981 Nasal congestion: Secondary | ICD-10-CM | POA: Diagnosis not present

## 2021-01-05 DIAGNOSIS — R0602 Shortness of breath: Secondary | ICD-10-CM | POA: Diagnosis not present

## 2021-01-05 DIAGNOSIS — R059 Cough, unspecified: Secondary | ICD-10-CM | POA: Diagnosis not present

## 2021-01-05 DIAGNOSIS — R0789 Other chest pain: Secondary | ICD-10-CM | POA: Diagnosis not present

## 2021-01-06 DIAGNOSIS — R300 Dysuria: Secondary | ICD-10-CM | POA: Diagnosis not present

## 2021-01-06 DIAGNOSIS — J069 Acute upper respiratory infection, unspecified: Secondary | ICD-10-CM | POA: Diagnosis not present

## 2021-02-02 DIAGNOSIS — N390 Urinary tract infection, site not specified: Secondary | ICD-10-CM | POA: Diagnosis not present

## 2021-03-17 DIAGNOSIS — N390 Urinary tract infection, site not specified: Secondary | ICD-10-CM | POA: Diagnosis not present

## 2021-03-17 DIAGNOSIS — R339 Retention of urine, unspecified: Secondary | ICD-10-CM | POA: Diagnosis not present

## 2021-03-17 DIAGNOSIS — N952 Postmenopausal atrophic vaginitis: Secondary | ICD-10-CM | POA: Diagnosis not present

## 2021-03-22 DIAGNOSIS — Z6831 Body mass index (BMI) 31.0-31.9, adult: Secondary | ICD-10-CM | POA: Diagnosis not present

## 2021-03-22 DIAGNOSIS — I10 Essential (primary) hypertension: Secondary | ICD-10-CM | POA: Diagnosis not present

## 2021-03-22 DIAGNOSIS — E559 Vitamin D deficiency, unspecified: Secondary | ICD-10-CM | POA: Diagnosis not present

## 2021-03-22 DIAGNOSIS — E785 Hyperlipidemia, unspecified: Secondary | ICD-10-CM | POA: Diagnosis not present

## 2021-03-22 DIAGNOSIS — Z85850 Personal history of malignant neoplasm of thyroid: Secondary | ICD-10-CM | POA: Diagnosis not present

## 2021-04-28 DIAGNOSIS — E890 Postprocedural hypothyroidism: Secondary | ICD-10-CM | POA: Diagnosis not present

## 2021-05-05 DIAGNOSIS — S4991XA Unspecified injury of right shoulder and upper arm, initial encounter: Secondary | ICD-10-CM | POA: Diagnosis not present

## 2021-05-05 DIAGNOSIS — M47812 Spondylosis without myelopathy or radiculopathy, cervical region: Secondary | ICD-10-CM | POA: Diagnosis not present

## 2021-05-05 DIAGNOSIS — M25511 Pain in right shoulder: Secondary | ICD-10-CM | POA: Diagnosis not present

## 2021-05-05 DIAGNOSIS — Z6832 Body mass index (BMI) 32.0-32.9, adult: Secondary | ICD-10-CM | POA: Diagnosis not present

## 2021-05-05 DIAGNOSIS — M898X1 Other specified disorders of bone, shoulder: Secondary | ICD-10-CM | POA: Diagnosis not present

## 2021-06-01 DIAGNOSIS — M4722 Other spondylosis with radiculopathy, cervical region: Secondary | ICD-10-CM | POA: Diagnosis not present

## 2021-06-01 DIAGNOSIS — M542 Cervicalgia: Secondary | ICD-10-CM | POA: Diagnosis not present

## 2021-06-03 ENCOUNTER — Other Ambulatory Visit: Payer: Self-pay | Admitting: Neurosurgery

## 2021-06-03 DIAGNOSIS — M4722 Other spondylosis with radiculopathy, cervical region: Secondary | ICD-10-CM

## 2021-06-19 ENCOUNTER — Ambulatory Visit
Admission: RE | Admit: 2021-06-19 | Discharge: 2021-06-19 | Disposition: A | Discharge status: Home / Self Care | Payer: BC Managed Care – PPO | Source: Ambulatory Visit | Attending: Neurosurgery | Admitting: Neurosurgery

## 2021-06-19 DIAGNOSIS — M4722 Other spondylosis with radiculopathy, cervical region: Secondary | ICD-10-CM

## 2021-06-19 DIAGNOSIS — M4802 Spinal stenosis, cervical region: Secondary | ICD-10-CM | POA: Diagnosis not present

## 2021-06-23 DIAGNOSIS — M4722 Other spondylosis with radiculopathy, cervical region: Secondary | ICD-10-CM | POA: Diagnosis not present

## 2021-06-27 DIAGNOSIS — E538 Deficiency of other specified B group vitamins: Secondary | ICD-10-CM | POA: Diagnosis not present

## 2021-06-27 DIAGNOSIS — E039 Hypothyroidism, unspecified: Secondary | ICD-10-CM | POA: Diagnosis not present

## 2021-06-27 DIAGNOSIS — R5383 Other fatigue: Secondary | ICD-10-CM | POA: Diagnosis not present

## 2021-06-27 DIAGNOSIS — I10 Essential (primary) hypertension: Secondary | ICD-10-CM | POA: Diagnosis not present

## 2021-06-27 DIAGNOSIS — E785 Hyperlipidemia, unspecified: Secondary | ICD-10-CM | POA: Diagnosis not present

## 2021-06-27 DIAGNOSIS — R7303 Prediabetes: Secondary | ICD-10-CM | POA: Diagnosis not present

## 2021-06-27 DIAGNOSIS — E559 Vitamin D deficiency, unspecified: Secondary | ICD-10-CM | POA: Diagnosis not present

## 2021-07-07 DIAGNOSIS — Z1231 Encounter for screening mammogram for malignant neoplasm of breast: Secondary | ICD-10-CM | POA: Diagnosis not present

## 2021-07-07 DIAGNOSIS — Z1239 Encounter for other screening for malignant neoplasm of breast: Secondary | ICD-10-CM | POA: Diagnosis not present

## 2021-07-13 DIAGNOSIS — L570 Actinic keratosis: Secondary | ICD-10-CM | POA: Diagnosis not present

## 2021-07-13 DIAGNOSIS — L821 Other seborrheic keratosis: Secondary | ICD-10-CM | POA: Diagnosis not present

## 2021-07-13 DIAGNOSIS — L578 Other skin changes due to chronic exposure to nonionizing radiation: Secondary | ICD-10-CM | POA: Diagnosis not present

## 2021-07-14 DIAGNOSIS — Z85850 Personal history of malignant neoplasm of thyroid: Secondary | ICD-10-CM | POA: Diagnosis not present

## 2021-07-16 DIAGNOSIS — S23420A Sprain of sternoclavicular (joint) (ligament), initial encounter: Secondary | ICD-10-CM | POA: Diagnosis not present

## 2021-07-19 DIAGNOSIS — N390 Urinary tract infection, site not specified: Secondary | ICD-10-CM | POA: Diagnosis not present

## 2021-07-22 DIAGNOSIS — E890 Postprocedural hypothyroidism: Secondary | ICD-10-CM | POA: Diagnosis not present

## 2021-07-28 DIAGNOSIS — Z85850 Personal history of malignant neoplasm of thyroid: Secondary | ICD-10-CM | POA: Diagnosis not present

## 2021-08-04 DIAGNOSIS — F32A Depression, unspecified: Secondary | ICD-10-CM | POA: Diagnosis not present

## 2021-08-04 DIAGNOSIS — Z6831 Body mass index (BMI) 31.0-31.9, adult: Secondary | ICD-10-CM | POA: Diagnosis not present

## 2021-08-19 DIAGNOSIS — N390 Urinary tract infection, site not specified: Secondary | ICD-10-CM | POA: Diagnosis not present

## 2021-08-19 DIAGNOSIS — R339 Retention of urine, unspecified: Secondary | ICD-10-CM | POA: Diagnosis not present

## 2021-08-19 DIAGNOSIS — N952 Postmenopausal atrophic vaginitis: Secondary | ICD-10-CM | POA: Diagnosis not present

## 2021-09-05 DIAGNOSIS — E890 Postprocedural hypothyroidism: Secondary | ICD-10-CM | POA: Diagnosis not present

## 2021-09-08 DIAGNOSIS — Z800 Family history of malignant neoplasm of digestive organs: Secondary | ICD-10-CM | POA: Diagnosis not present

## 2021-09-08 DIAGNOSIS — K219 Gastro-esophageal reflux disease without esophagitis: Secondary | ICD-10-CM | POA: Diagnosis not present

## 2021-09-08 DIAGNOSIS — R1013 Epigastric pain: Secondary | ICD-10-CM | POA: Diagnosis not present

## 2021-09-08 DIAGNOSIS — Z1211 Encounter for screening for malignant neoplasm of colon: Secondary | ICD-10-CM | POA: Diagnosis not present

## 2021-09-15 DIAGNOSIS — K769 Liver disease, unspecified: Secondary | ICD-10-CM | POA: Diagnosis not present

## 2021-09-15 DIAGNOSIS — Z9049 Acquired absence of other specified parts of digestive tract: Secondary | ICD-10-CM | POA: Diagnosis not present

## 2021-09-15 DIAGNOSIS — K5900 Constipation, unspecified: Secondary | ICD-10-CM | POA: Diagnosis not present

## 2021-09-15 DIAGNOSIS — K7689 Other specified diseases of liver: Secondary | ICD-10-CM | POA: Diagnosis not present

## 2021-09-15 DIAGNOSIS — K8689 Other specified diseases of pancreas: Secondary | ICD-10-CM | POA: Diagnosis not present

## 2021-09-15 DIAGNOSIS — I700 Atherosclerosis of aorta: Secondary | ICD-10-CM | POA: Diagnosis not present

## 2021-09-15 DIAGNOSIS — K862 Cyst of pancreas: Secondary | ICD-10-CM | POA: Diagnosis not present

## 2021-09-20 IMAGING — MR MR CERVICAL SPINE W/O CM
4 of 5 series · 29 of 48 positions shown · non-contrast
Comparison: Radiograph from 05/05/2021.

CLINICAL DATA: Initial evaluation for neck pain with bilateral arm
pain for 1 year.

EXAM:
MRI CERVICAL SPINE WITHOUT CONTRAST
TECHNIQUE: Multiplanar, multisequence MR imaging of the cervical spine was
performed. No intravenous contrast was administered.

[Series 3: T2 · sagittal · 3.0mm · 0.66mm/px · 8 of 18 slices shown (1 of 2)]
[im 1/18]
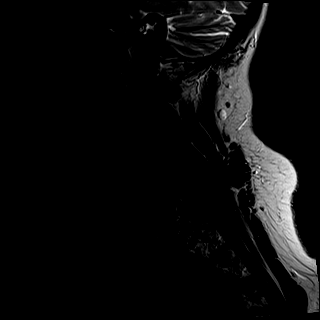
[im 3/18]
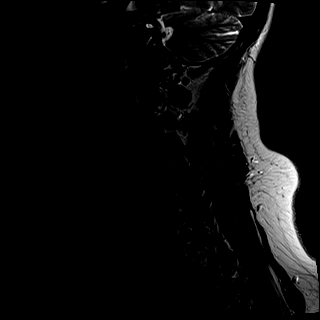
[im 5/18]
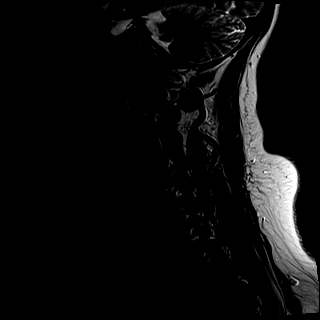
[im 8/18]
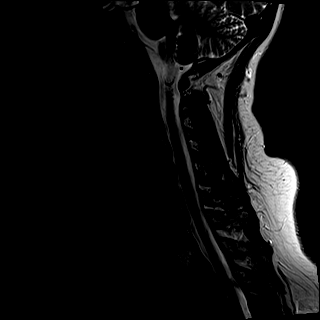
[im 10/18]
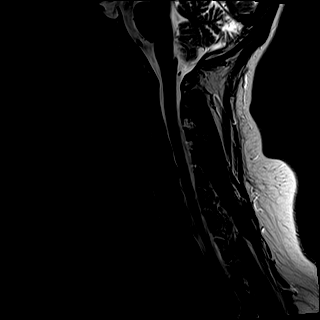
[im 13/18]
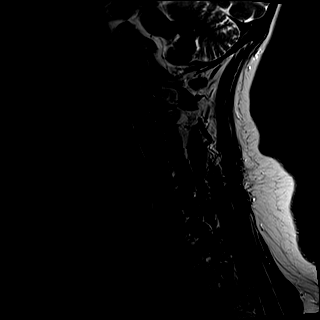
[im 15/18]
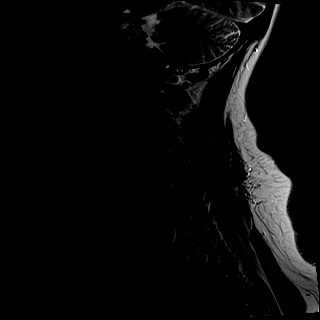
[im 18/18]
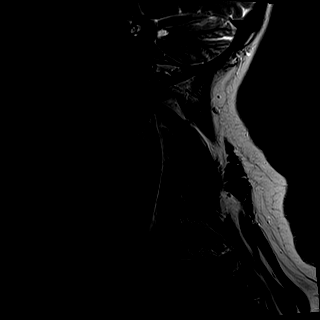

[Series 4: T1 · sagittal · 3.0mm · 0.41mm/px · 8 of 18 slices shown]
[im 1/18]
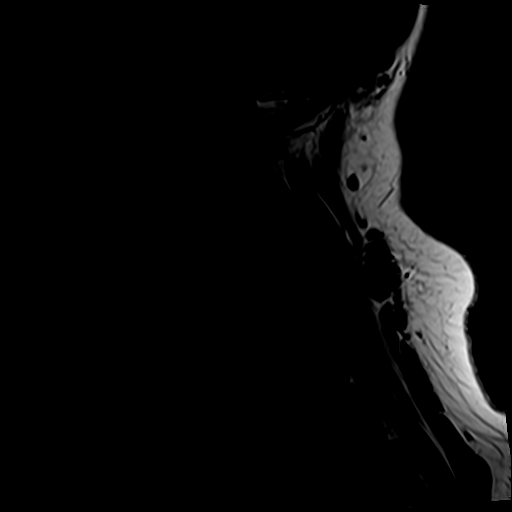
[im 3/18]
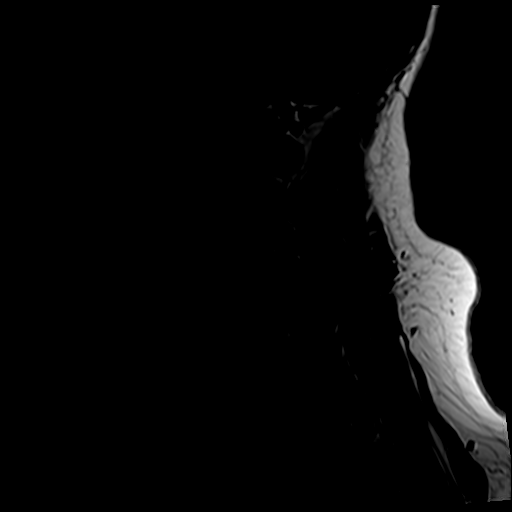
[im 5/18]
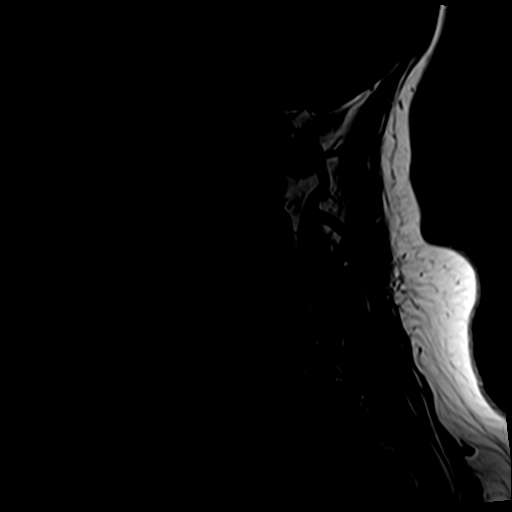
[im 8/18]
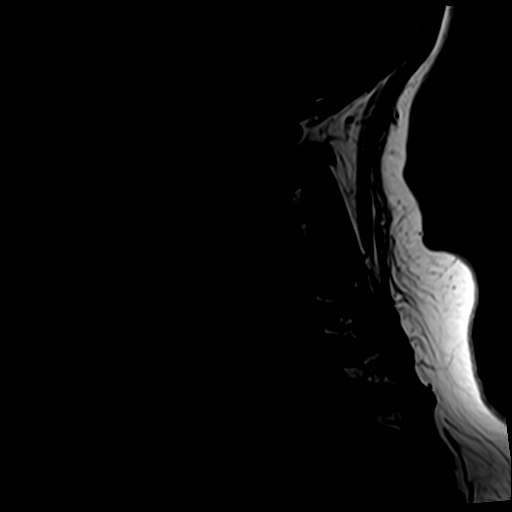
[im 10/18]
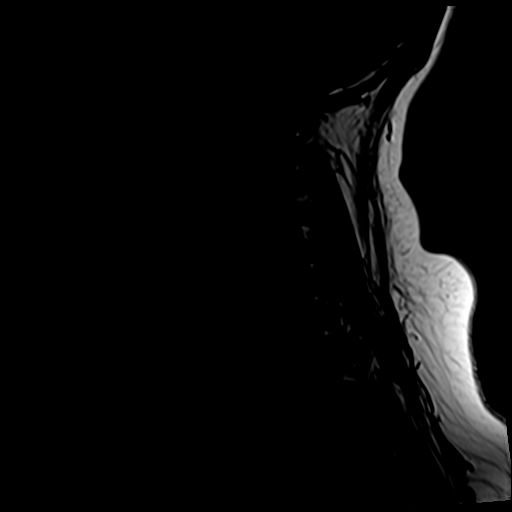
[im 13/18]
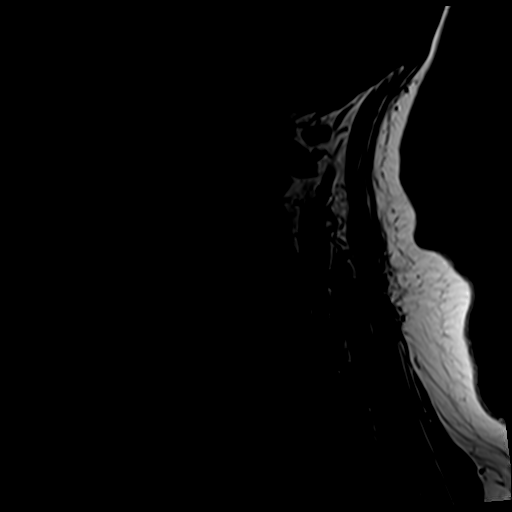
[im 15/18]
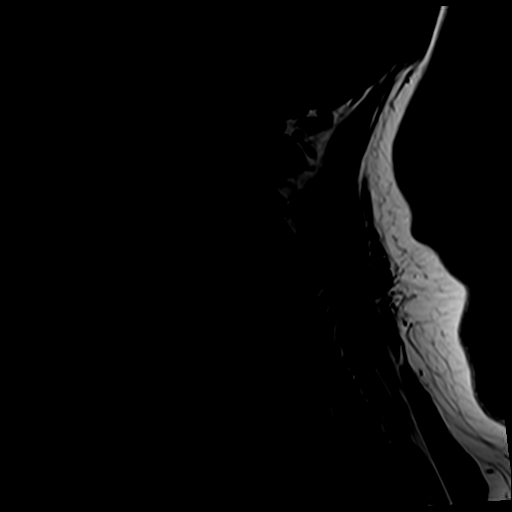
[im 18/18]
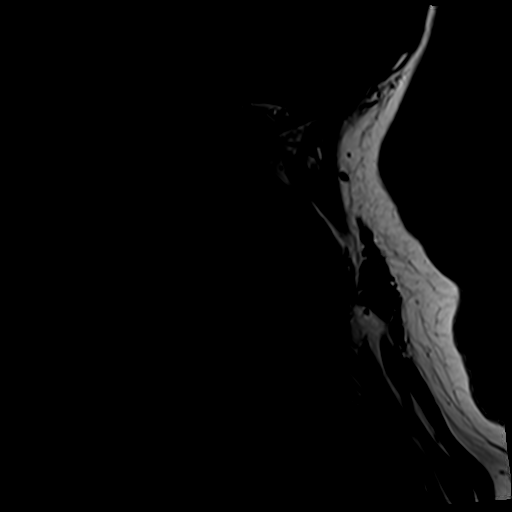

[Series 5: tir sag · sagittal · 3.0mm · 0.41mm/px · 4 of 18 slices shown]
[im 1/18]
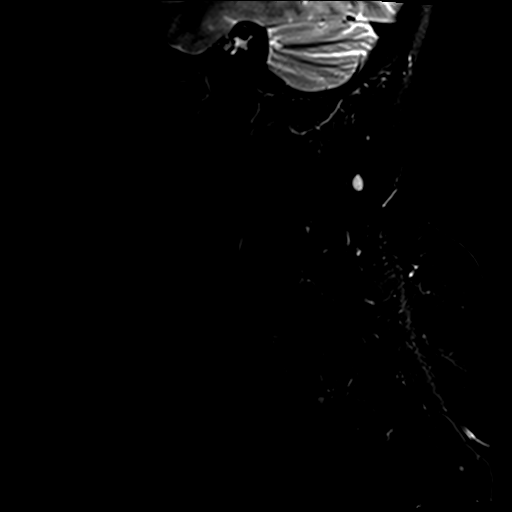
[im 3/18]
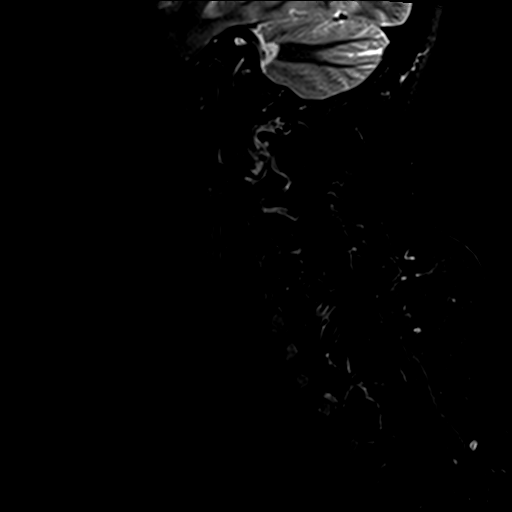
[im 10/18]
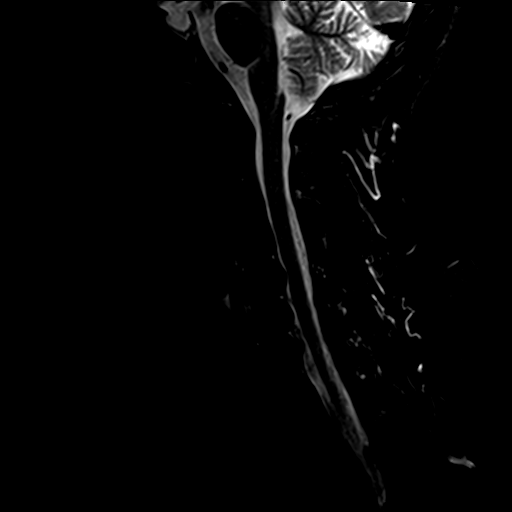
[im 15/18]
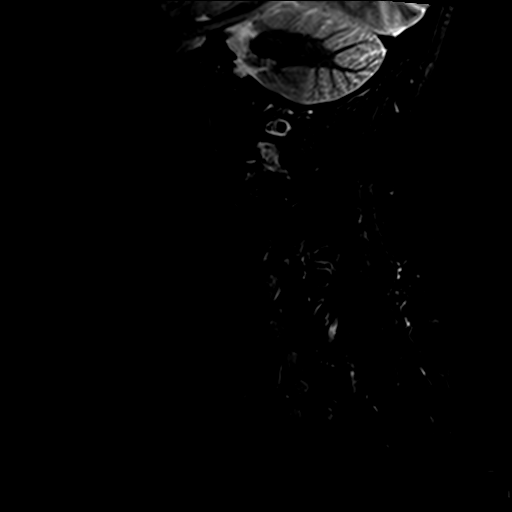

[Series 7: T2 · axial · 3.0mm · 0.70mm/px · z∈[-45,+46]mm · 9 of 27 slices shown (2 of 2)]
[im 1/27]
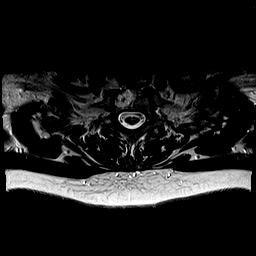
[im 5/27]
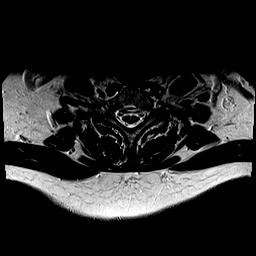
[im 8/27]
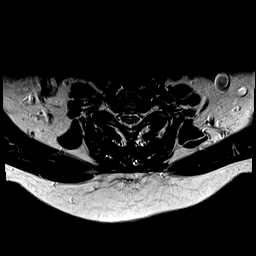
[im 12/27]
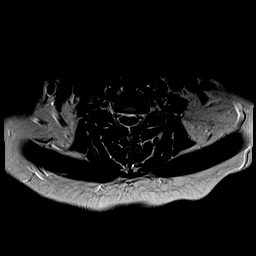
[im 15/27]
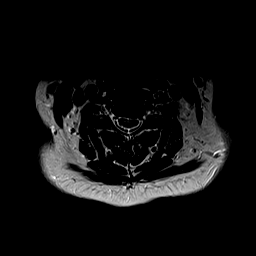
[im 19/27]
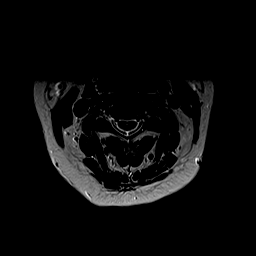
[im 22/27]
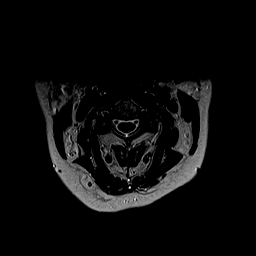
[im 24/27]
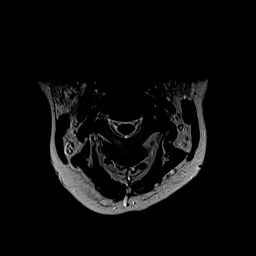
[im 27/27]
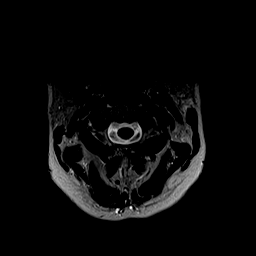

[29 of 48 positions shown; findings below may reference images not displayed]

FINDINGS: Alignment: Straightening of the normal cervical lordosis. No
listhesis.

Vertebrae: Vertebral body height maintained without acute or chronic
fracture. Bone marrow signal intensity within normal limits. Benign
hemangioma noted within the T1 vertebral body. No other discrete or
worrisome osseous lesions. No abnormal marrow edema.

Cord: Normal signal and morphology.

Posterior Fossa, vertebral arteries, paraspinal tissues: Partially
empty sella noted. Visualized brain and posterior fossa otherwise
unremarkable. Craniocervical junction normal. Paraspinous and
prevertebral soft tissues within normal limits. Normal flow voids
seen within the vertebral arteries bilaterally.

Disc levels:

C2-C3: Negative interspace. Mild left greater than right facet
hypertrophy. No canal or foraminal stenosis.

C3-C4: Mild uncovertebral spurring without significant disc bulge.
Mild facet hypertrophy. No canal or foraminal stenosis.

C4-C5: Left eccentric disc bulge with associated left greater than
right uncovertebral hypertrophy. Flattening and partial effacement
of the ventral thecal sac with mild flattening of the ventral cord.
Mild spinal stenosis. Moderate left C5 foraminal narrowing. Right
neural foramen remains patent.

C5-C6: Disc bulge with left greater than right uncovertebral
hypertrophy. Mild flattening of the ventral thecal sac without
significant spinal stenosis. Moderate left C6 foraminal narrowing.
Right neural foramina remains patent.

C6-C7: Degenerative intervertebral disc space narrowing with diffuse
disc osteophyte complex. Mild flattening of the ventral thecal sac
without significant spinal stenosis. Mild to moderate right worse
than left C7 foraminal stenosis.

C7-T1: Minimal annular disc bulge. Mild left-sided facet
hypertrophy. No canal or foraminal stenosis.

T1-2: Seen only on sagittal projection. Small central disc extrusion
with superior migration without significant stenosis.

T2-3: Seen only on sagittal projection. Mild disc bulge. Right worse
than left facet arthrosis. No significant spinal stenosis. Moderate
bilateral foraminal narrowing, worse on the right.
IMPRESSION: 1. Multilevel cervical spondylosis with resultant mild spinal
stenosis at C4-5.
2. Multifactorial degenerative changes with resultant multilevel
foraminal narrowing as above. Notable findings include moderate left
C5 and C6 foraminal narrowing, with mild to moderate bilateral C7
foraminal stenosis.

## 2021-09-23 DIAGNOSIS — M4722 Other spondylosis with radiculopathy, cervical region: Secondary | ICD-10-CM | POA: Diagnosis not present

## 2021-09-23 DIAGNOSIS — Z6831 Body mass index (BMI) 31.0-31.9, adult: Secondary | ICD-10-CM | POA: Diagnosis not present

## 2021-09-23 DIAGNOSIS — R030 Elevated blood-pressure reading, without diagnosis of hypertension: Secondary | ICD-10-CM | POA: Diagnosis not present

## 2021-10-13 DIAGNOSIS — E785 Hyperlipidemia, unspecified: Secondary | ICD-10-CM | POA: Diagnosis not present

## 2021-10-13 DIAGNOSIS — Z85850 Personal history of malignant neoplasm of thyroid: Secondary | ICD-10-CM | POA: Diagnosis not present

## 2021-10-13 DIAGNOSIS — F32A Depression, unspecified: Secondary | ICD-10-CM | POA: Diagnosis not present

## 2021-10-13 DIAGNOSIS — I10 Essential (primary) hypertension: Secondary | ICD-10-CM | POA: Diagnosis not present

## 2021-10-13 DIAGNOSIS — J45909 Unspecified asthma, uncomplicated: Secondary | ICD-10-CM | POA: Diagnosis not present

## 2021-10-13 DIAGNOSIS — E559 Vitamin D deficiency, unspecified: Secondary | ICD-10-CM | POA: Diagnosis not present

## 2021-10-31 DIAGNOSIS — Z1211 Encounter for screening for malignant neoplasm of colon: Secondary | ICD-10-CM | POA: Diagnosis not present

## 2021-10-31 DIAGNOSIS — R1013 Epigastric pain: Secondary | ICD-10-CM | POA: Diagnosis not present

## 2021-10-31 DIAGNOSIS — K640 First degree hemorrhoids: Secondary | ICD-10-CM | POA: Diagnosis not present

## 2021-10-31 DIAGNOSIS — K449 Diaphragmatic hernia without obstruction or gangrene: Secondary | ICD-10-CM | POA: Diagnosis not present

## 2021-10-31 DIAGNOSIS — K5730 Diverticulosis of large intestine without perforation or abscess without bleeding: Secondary | ICD-10-CM | POA: Diagnosis not present

## 2021-10-31 DIAGNOSIS — K222 Esophageal obstruction: Secondary | ICD-10-CM | POA: Diagnosis not present

## 2021-10-31 DIAGNOSIS — Z800 Family history of malignant neoplasm of digestive organs: Secondary | ICD-10-CM | POA: Diagnosis not present

## 2021-10-31 DIAGNOSIS — K219 Gastro-esophageal reflux disease without esophagitis: Secondary | ICD-10-CM | POA: Diagnosis not present

## 2021-11-08 DIAGNOSIS — Z85850 Personal history of malignant neoplasm of thyroid: Secondary | ICD-10-CM | POA: Diagnosis not present

## 2021-11-08 DIAGNOSIS — E890 Postprocedural hypothyroidism: Secondary | ICD-10-CM | POA: Diagnosis not present

## 2021-12-26 DIAGNOSIS — H66002 Acute suppurative otitis media without spontaneous rupture of ear drum, left ear: Secondary | ICD-10-CM | POA: Diagnosis not present

## 2021-12-26 DIAGNOSIS — J019 Acute sinusitis, unspecified: Secondary | ICD-10-CM | POA: Diagnosis not present

## 2021-12-26 DIAGNOSIS — H6502 Acute serous otitis media, left ear: Secondary | ICD-10-CM | POA: Diagnosis not present

## 2021-12-26 DIAGNOSIS — L820 Inflamed seborrheic keratosis: Secondary | ICD-10-CM | POA: Diagnosis not present

## 2021-12-26 DIAGNOSIS — J029 Acute pharyngitis, unspecified: Secondary | ICD-10-CM | POA: Diagnosis not present

## 2021-12-30 DIAGNOSIS — M170 Bilateral primary osteoarthritis of knee: Secondary | ICD-10-CM | POA: Diagnosis not present

## 2021-12-30 DIAGNOSIS — M25562 Pain in left knee: Secondary | ICD-10-CM | POA: Diagnosis not present

## 2021-12-30 DIAGNOSIS — M25561 Pain in right knee: Secondary | ICD-10-CM | POA: Diagnosis not present

## 2022-01-12 DIAGNOSIS — R0789 Other chest pain: Secondary | ICD-10-CM | POA: Diagnosis not present

## 2022-01-13 DIAGNOSIS — E890 Postprocedural hypothyroidism: Secondary | ICD-10-CM | POA: Diagnosis not present

## 2022-01-16 DIAGNOSIS — R0789 Other chest pain: Secondary | ICD-10-CM | POA: Diagnosis not present

## 2022-01-16 DIAGNOSIS — E559 Vitamin D deficiency, unspecified: Secondary | ICD-10-CM | POA: Diagnosis not present

## 2022-01-16 DIAGNOSIS — F419 Anxiety disorder, unspecified: Secondary | ICD-10-CM | POA: Diagnosis not present

## 2022-01-16 DIAGNOSIS — Z6832 Body mass index (BMI) 32.0-32.9, adult: Secondary | ICD-10-CM | POA: Diagnosis not present

## 2022-03-07 DIAGNOSIS — L299 Pruritus, unspecified: Secondary | ICD-10-CM | POA: Diagnosis not present

## 2022-03-07 DIAGNOSIS — L209 Atopic dermatitis, unspecified: Secondary | ICD-10-CM | POA: Diagnosis not present

## 2022-03-22 DIAGNOSIS — N390 Urinary tract infection, site not specified: Secondary | ICD-10-CM | POA: Diagnosis not present

## 2022-04-17 DIAGNOSIS — E785 Hyperlipidemia, unspecified: Secondary | ICD-10-CM | POA: Diagnosis not present

## 2022-04-17 DIAGNOSIS — F419 Anxiety disorder, unspecified: Secondary | ICD-10-CM | POA: Diagnosis not present

## 2022-04-17 DIAGNOSIS — I10 Essential (primary) hypertension: Secondary | ICD-10-CM | POA: Diagnosis not present

## 2022-04-17 DIAGNOSIS — E559 Vitamin D deficiency, unspecified: Secondary | ICD-10-CM | POA: Diagnosis not present

## 2022-04-17 DIAGNOSIS — Z85850 Personal history of malignant neoplasm of thyroid: Secondary | ICD-10-CM | POA: Diagnosis not present

## 2022-05-09 DIAGNOSIS — R42 Dizziness and giddiness: Secondary | ICD-10-CM | POA: Diagnosis not present

## 2022-05-09 DIAGNOSIS — H6982 Other specified disorders of Eustachian tube, left ear: Secondary | ICD-10-CM | POA: Diagnosis not present

## 2022-05-09 DIAGNOSIS — H6502 Acute serous otitis media, left ear: Secondary | ICD-10-CM | POA: Diagnosis not present

## 2022-05-09 DIAGNOSIS — Z760 Encounter for issue of repeat prescription: Secondary | ICD-10-CM | POA: Diagnosis not present

## 2022-05-12 DIAGNOSIS — E042 Nontoxic multinodular goiter: Secondary | ICD-10-CM | POA: Diagnosis not present

## 2022-05-12 DIAGNOSIS — E890 Postprocedural hypothyroidism: Secondary | ICD-10-CM | POA: Diagnosis not present

## 2022-05-17 DIAGNOSIS — J019 Acute sinusitis, unspecified: Secondary | ICD-10-CM | POA: Diagnosis not present

## 2022-05-26 DIAGNOSIS — M4722 Other spondylosis with radiculopathy, cervical region: Secondary | ICD-10-CM | POA: Diagnosis not present

## 2022-05-26 DIAGNOSIS — Z6831 Body mass index (BMI) 31.0-31.9, adult: Secondary | ICD-10-CM | POA: Diagnosis not present

## 2022-05-29 DIAGNOSIS — R1313 Dysphagia, pharyngeal phase: Secondary | ICD-10-CM | POA: Diagnosis not present

## 2022-05-29 DIAGNOSIS — H9202 Otalgia, left ear: Secondary | ICD-10-CM | POA: Diagnosis not present

## 2022-06-02 DIAGNOSIS — H9202 Otalgia, left ear: Secondary | ICD-10-CM | POA: Diagnosis not present

## 2022-06-16 DIAGNOSIS — H9202 Otalgia, left ear: Secondary | ICD-10-CM | POA: Diagnosis not present

## 2022-06-23 DIAGNOSIS — H9202 Otalgia, left ear: Secondary | ICD-10-CM | POA: Diagnosis not present

## 2022-06-30 DIAGNOSIS — H9202 Otalgia, left ear: Secondary | ICD-10-CM | POA: Diagnosis not present

## 2022-07-07 DIAGNOSIS — M542 Cervicalgia: Secondary | ICD-10-CM | POA: Diagnosis not present

## 2022-07-07 DIAGNOSIS — H9202 Otalgia, left ear: Secondary | ICD-10-CM | POA: Diagnosis not present

## 2022-07-14 DIAGNOSIS — M542 Cervicalgia: Secondary | ICD-10-CM | POA: Diagnosis not present

## 2022-07-14 DIAGNOSIS — H9202 Otalgia, left ear: Secondary | ICD-10-CM | POA: Diagnosis not present

## 2022-07-17 DIAGNOSIS — E559 Vitamin D deficiency, unspecified: Secondary | ICD-10-CM | POA: Diagnosis not present

## 2022-07-17 DIAGNOSIS — I10 Essential (primary) hypertension: Secondary | ICD-10-CM | POA: Diagnosis not present

## 2022-07-17 DIAGNOSIS — E785 Hyperlipidemia, unspecified: Secondary | ICD-10-CM | POA: Diagnosis not present

## 2022-07-17 DIAGNOSIS — J45909 Unspecified asthma, uncomplicated: Secondary | ICD-10-CM | POA: Diagnosis not present

## 2022-07-17 DIAGNOSIS — Z85850 Personal history of malignant neoplasm of thyroid: Secondary | ICD-10-CM | POA: Diagnosis not present

## 2022-07-18 DIAGNOSIS — M79661 Pain in right lower leg: Secondary | ICD-10-CM | POA: Diagnosis not present

## 2022-07-18 DIAGNOSIS — M79662 Pain in left lower leg: Secondary | ICD-10-CM | POA: Diagnosis not present

## 2022-07-19 DIAGNOSIS — L578 Other skin changes due to chronic exposure to nonionizing radiation: Secondary | ICD-10-CM | POA: Diagnosis not present

## 2022-07-19 DIAGNOSIS — D225 Melanocytic nevi of trunk: Secondary | ICD-10-CM | POA: Diagnosis not present

## 2022-07-21 DIAGNOSIS — Z1231 Encounter for screening mammogram for malignant neoplasm of breast: Secondary | ICD-10-CM | POA: Diagnosis not present

## 2022-08-16 DIAGNOSIS — H9192 Unspecified hearing loss, left ear: Secondary | ICD-10-CM | POA: Diagnosis not present

## 2022-08-16 DIAGNOSIS — H938X2 Other specified disorders of left ear: Secondary | ICD-10-CM | POA: Diagnosis not present

## 2022-08-16 DIAGNOSIS — H9202 Otalgia, left ear: Secondary | ICD-10-CM | POA: Diagnosis not present

## 2022-09-06 DIAGNOSIS — N644 Mastodynia: Secondary | ICD-10-CM | POA: Diagnosis not present

## 2022-09-15 DIAGNOSIS — R339 Retention of urine, unspecified: Secondary | ICD-10-CM | POA: Diagnosis not present

## 2022-09-15 DIAGNOSIS — N3281 Overactive bladder: Secondary | ICD-10-CM | POA: Diagnosis not present

## 2022-09-18 ENCOUNTER — Ambulatory Visit (INDEPENDENT_AMBULATORY_CARE_PROVIDER_SITE_OTHER): Payer: BC Managed Care – PPO

## 2022-09-18 ENCOUNTER — Telehealth: Payer: Self-pay

## 2022-09-18 ENCOUNTER — Ambulatory Visit (INDEPENDENT_AMBULATORY_CARE_PROVIDER_SITE_OTHER): Payer: BC Managed Care – PPO | Admitting: Orthopaedic Surgery

## 2022-09-18 VITALS — Ht 64.0 in | Wt 189.0 lb

## 2022-09-18 DIAGNOSIS — M7062 Trochanteric bursitis, left hip: Secondary | ICD-10-CM | POA: Diagnosis not present

## 2022-09-18 DIAGNOSIS — M25551 Pain in right hip: Secondary | ICD-10-CM

## 2022-09-18 DIAGNOSIS — G8929 Other chronic pain: Secondary | ICD-10-CM

## 2022-09-18 DIAGNOSIS — N644 Mastodynia: Secondary | ICD-10-CM | POA: Diagnosis not present

## 2022-09-18 DIAGNOSIS — M25562 Pain in left knee: Secondary | ICD-10-CM

## 2022-09-18 DIAGNOSIS — M7061 Trochanteric bursitis, right hip: Secondary | ICD-10-CM

## 2022-09-18 MED ORDER — LIDOCAINE HCL 1 % IJ SOLN
3.0000 mL | INTRAMUSCULAR | Status: AC | PRN
  Administered 2022-09-18: 3 mL

## 2022-09-18 MED ORDER — METHYLPREDNISOLONE ACETATE 40 MG/ML IJ SUSP
40.0000 mg | INTRAMUSCULAR | Status: AC | PRN
  Administered 2022-09-18: 40 mg via INTRA_ARTICULAR

## 2022-09-18 NOTE — Telephone Encounter (Signed)
Left knee gel injection ?

## 2022-09-18 NOTE — Progress Notes (Signed)
Office Visit Note   Patient: Christina Mcdonald           Date of Birth: 1966-03-23           MRN: [id number] Visit Date: 09/18/2022              Requested by: Earlyne Iba, NP [address] [place],  [place] [address] PCP: Earlyne Iba, NP   Assessment & Plan: Visit Diagnoses:  1. Pain in right hip   2. Chronic pain of left knee   3. Trochanteric bursitis, right hip   4. Trochanteric bursitis, left hip     Plan: I did talk to the patient about trying hyaluronic acid for her left knee and she does wish for this conservative treatment measures given the failure of steroid injections as well as activity modification and time.  Also recommended steroid injections in both hip trochanteric areas which she agreed to and tolerated well.  We will see her back in follow-up for hopefully placing hyaluronic acid in the left knee.  All questions and concerns were answered and addressed.  Follow-Up Instructions: The patient will be contacted for follow-up once the gel injection has been approved.  Orders:  Orders Placed This Encounter  Procedures   Large Joint Inj   Large Joint Inj   XR Knee 1-2 Views Left   XR HIP UNILAT W OR W/O PELVIS 1V RIGHT   No orders of the defined types were placed in this encounter.     Procedures: Large Joint Inj: R greater trochanter on 09/18/2022 9:19 AM Indications: pain and diagnostic evaluation Details: 22 G 1.5 in needle, lateral approach  Arthrogram: No  Medications: 3 mL lidocaine 1 %; 40 mg methylPREDNISolone acetate 40 MG/ML Outcome: tolerated well, no immediate complications Procedure, treatment alternatives, risks and benefits explained, specific risks discussed. Consent was given by the patient. Immediately prior to procedure a time out was called to verify the correct patient, procedure, equipment, support staff and site/side marked as required. Patient was prepped and draped in the usual sterile fashion.    Large Joint Inj: L greater  trochanter on 09/18/2022 9:19 AM Indications: pain and diagnostic evaluation Details: 22 G 1.5 in needle, lateral approach  Arthrogram: No  Medications: 3 mL lidocaine 1 %; 40 mg methylPREDNISolone acetate 40 MG/ML Outcome: tolerated well, no immediate complications Procedure, treatment alternatives, risks and benefits explained, specific risks discussed. Consent was given by the patient. Immediately prior to procedure a time out was called to verify the correct patient, procedure, equipment, support staff and site/side marked as required. Patient was prepped and draped in the usual sterile fashion.       Clinical Data: No additional findings.   Subjective: Chief Complaint  Patient presents with   Right Hip - Pain   Left Knee - Pain  The patient comes in today for the first time with left knee pain but also bilateral hip pain.  The right hip hurts her worse than the left and she points to the trochanteric area as source of her pain.  She has tried to exercise and lose weight.  Her left knee hurts mainly in the medial aspect of the knee but also behind the patella and really the back of the knee.  She has had at least 2 steroid injections remotely in her left knee with the last one being earlier this year.  She has never had hyaluronic acid for the left knee and has not had surgery on  that knee.  She is not a diabetic.  She is worked on activity modification and taking anti-inflammatories.  She states that the second steroid injection in her left knee had no effect.  HPI  Review of Systems There is no listed fever, chills, nausea, vomiting  Objective: Vital Signs: Ht '5\' 4"'$  (1.626 m)   Wt 189 lb (85.7 kg)   BMI 32.44 kg/m   Physical Exam She is alert and orient x3 and in no acute distress Ortho Exam Examination of her left knee does show varus malalignment that is correctable but she has medial joint line tenderness and patellofemoral crepitation.  There is pain in the posterior  aspect of her knee but it is ligamentously stable.  Examination of both hip shows a move smoothly and fluidly with only pain over the trochanteric area of both hips. Specialty Comments:  No specialty comments available.  Imaging: XR HIP UNILAT W OR W/O PELVIS 1V RIGHT  Result Date: 09/18/2022 An AP pelvis lateral right hip shows well-maintained joint space bilaterally.  XR Knee 1-2 Views Left  Result Date: 09/18/2022 2 views of the left knee show moderate patellofemoral and medial compartment arthritis that is close to severe.  There is varus malalignment and near bone-on-bone wear the medial compartment and patellofemoral joint.    PMFS History: There are no problems to display for this patient.  Past Medical History:  Diagnosis Date   Cancer of thyroid ([place])     No family history on file.  Past Surgical History:  Procedure Laterality Date   ABDOMINAL HYSTERECTOMY     CHOLECYSTECTOMY     THYROID SURGERY     TONSILLECTOMY     Social History   Occupational History   Not on file  Tobacco Use   Smoking status: Never   Smokeless tobacco: Never  Vaping Use   Vaping Use: Never used  Substance and Sexual Activity   Alcohol use: Never   Drug use: Never   Sexual activity: Not on file

## 2022-09-19 NOTE — Telephone Encounter (Signed)
VOB submitted for SynviscOne, left knee  

## 2022-10-17 DIAGNOSIS — Z23 Encounter for immunization: Secondary | ICD-10-CM | POA: Diagnosis not present

## 2022-10-24 ENCOUNTER — Telehealth: Payer: Self-pay

## 2022-10-24 NOTE — Telephone Encounter (Signed)
Called and left a VM advising patient that her insurance, [employer] does not cover gel injections and if she would like to try TriVisc to give the office a call back to discuss.

## 2022-11-06 DIAGNOSIS — G5762 Lesion of plantar nerve, left lower limb: Secondary | ICD-10-CM | POA: Diagnosis not present

## 2022-11-06 DIAGNOSIS — M2021 Hallux rigidus, right foot: Secondary | ICD-10-CM | POA: Diagnosis not present

## 2022-11-06 DIAGNOSIS — M79672 Pain in left foot: Secondary | ICD-10-CM | POA: Diagnosis not present

## 2022-11-06 DIAGNOSIS — L84 Corns and callosities: Secondary | ICD-10-CM | POA: Diagnosis not present

## 2022-11-06 DIAGNOSIS — M79671 Pain in right foot: Secondary | ICD-10-CM | POA: Diagnosis not present

## 2022-11-16 DIAGNOSIS — Z6832 Body mass index (BMI) 32.0-32.9, adult: Secondary | ICD-10-CM | POA: Diagnosis not present

## 2022-11-16 DIAGNOSIS — Z85850 Personal history of malignant neoplasm of thyroid: Secondary | ICD-10-CM | POA: Diagnosis not present

## 2022-11-16 DIAGNOSIS — I10 Essential (primary) hypertension: Secondary | ICD-10-CM | POA: Diagnosis not present

## 2022-11-16 DIAGNOSIS — E559 Vitamin D deficiency, unspecified: Secondary | ICD-10-CM | POA: Diagnosis not present

## 2022-11-16 DIAGNOSIS — E785 Hyperlipidemia, unspecified: Secondary | ICD-10-CM | POA: Diagnosis not present

## 2022-11-16 DIAGNOSIS — E039 Hypothyroidism, unspecified: Secondary | ICD-10-CM | POA: Diagnosis not present

## 2022-11-17 DIAGNOSIS — E890 Postprocedural hypothyroidism: Secondary | ICD-10-CM | POA: Diagnosis not present

## 2022-11-17 DIAGNOSIS — Z85850 Personal history of malignant neoplasm of thyroid: Secondary | ICD-10-CM | POA: Diagnosis not present

## 2022-12-06 ENCOUNTER — Ambulatory Visit: Payer: BC Managed Care – PPO | Admitting: Orthopaedic Surgery

## 2022-12-08 DIAGNOSIS — N3010 Interstitial cystitis (chronic) without hematuria: Secondary | ICD-10-CM | POA: Diagnosis not present

## 2022-12-20 ENCOUNTER — Encounter: Payer: Self-pay | Admitting: Cardiology

## 2022-12-20 ENCOUNTER — Ambulatory Visit: Payer: BC Managed Care – PPO | Attending: Cardiology | Admitting: Cardiology

## 2022-12-20 ENCOUNTER — Ambulatory Visit (INDEPENDENT_AMBULATORY_CARE_PROVIDER_SITE_OTHER): Payer: BC Managed Care – PPO

## 2022-12-20 VITALS — BP 136/88 | HR 72 | Ht 64.0 in | Wt 185.0 lb

## 2022-12-20 DIAGNOSIS — R002 Palpitations: Secondary | ICD-10-CM

## 2022-12-20 DIAGNOSIS — E669 Obesity, unspecified: Secondary | ICD-10-CM | POA: Diagnosis not present

## 2022-12-20 DIAGNOSIS — R079 Chest pain, unspecified: Secondary | ICD-10-CM | POA: Diagnosis not present

## 2022-12-20 LAB — BASIC METABOLIC PANEL
BUN/Creatinine Ratio: 25 — ABNORMAL HIGH (ref 9–23)
BUN: 20 mg/dL (ref 6–24)
CO2: 24 mmol/L (ref 20–29)
Calcium: 9.4 mg/dL (ref 8.7–10.2)
Chloride: 104 mmol/L (ref 96–106)
Creatinine, Ser: 0.81 mg/dL (ref 0.57–1.00)
Glucose: 91 mg/dL (ref 70–99)
Potassium: 4.4 mmol/L (ref 3.5–5.2)
Sodium: 141 mmol/L (ref 134–144)
eGFR: 84 mL/min/{1.73_m2} (ref >59)

## 2022-12-20 LAB — MAGNESIUM: Magnesium: 2.4 mg/dL — ABNORMAL HIGH (ref 1.6–2.3)

## 2022-12-20 MED ORDER — PREDNISONE 50 MG PO TABS: ORAL_TABLET | ORAL | 0 refills | Status: AC

## 2022-12-20 MED ORDER — METOPROLOL TARTRATE 100 MG PO TABS: ORAL_TABLET | ORAL | 0 refills | Status: AC

## 2022-12-20 NOTE — Progress Notes (Signed)
Cardiology Office Note:    Date:  12/23/2022   ID:  Christina, Mcdonald 08/12/1966, MRN [id number]  PCP:  Earlyne Iba, NP  Cardiologist:  Berniece Salines, DO  Electrophysiologist:  None   Referring MD: Earlyne Iba, NP   Chief Complaint  Patient presents with   New Patient (Initial Visit)        Chest Pain    Tightness.    History of Present Illness:    Christina Mcdonald is a 57 y.o. female with a hx of      Past Medical History:  Diagnosis Date   Cancer of thyroid ([place])     Past Surgical History:  Procedure Laterality Date   ABDOMINAL HYSTERECTOMY     CHOLECYSTECTOMY     THYROID SURGERY     TONSILLECTOMY      Current Medications: Current Meds  Medication Sig   benazepril-hydrochlorthiazide (LOTENSIN HCT) 10-12.5 MG tablet Take 1 tablet by mouth daily.   cetirizine (ZYRTEC) 10 MG tablet Take 10 mg by mouth daily.   levothyroxine (SYNTHROID) 88 MCG tablet Take 88 mcg by mouth daily before breakfast. Except take half tablet on Sunday.   metoprolol tartrate (LOPRESSOR) 100 MG tablet Take 2 hours prior to CT   montelukast (SINGULAIR) 10 MG tablet Take 10 mg by mouth at bedtime.   predniSONE (DELTASONE) 50 MG tablet Take 50 mg (one tablet) 13 hours before procedure. Take 50 mg (one tablet) 7 house before procedure. Take 50 mg (one tablet) before leaving home before procedure.   Vitamin D, Ergocalciferol, (DRISDOL) 1.25 MG (50000 UNIT) CAPS capsule Take 50,000 Units by mouth. EVERY OTHER WEEK     Allergies:   Bee venom, Ivp dye [iodinated contrast media], and Keflex [cephalexin]   Social History   Socioeconomic History   Marital status: Married    Spouse name: Not on file   Number of children: Not on file   Years of education: Not on file   Highest education level: Not on file  Occupational History   Not on file  Tobacco Use   Smoking status: Never   Smokeless tobacco: Never  Vaping Use   Vaping Use: Never used  Substance and Sexual Activity    Alcohol use: Never   Drug use: Never   Sexual activity: Not on file  Other Topics Concern   Not on file  Social History Narrative   Not on file   Social Determinants of Health   Financial Resource Strain: Not on file  Food Insecurity: Not on file  Transportation Needs: Not on file  Physical Activity: Not on file  Stress: Not on file  Social Connections: Not on file     Family History: The patient's family history is not on file.  ROS:   Review of Systems  Constitution: Negative for decreased appetite, fever and weight gain.  HENT: Negative for congestion, ear discharge, hoarse voice and sore throat.   Eyes: Negative for discharge, redness, vision loss in right eye and visual halos.  Cardiovascular: Negative for chest pain, dyspnea on exertion, leg swelling, orthopnea and palpitations.  Respiratory: Negative for cough, hemoptysis, shortness of breath and snoring.   Endocrine: Negative for heat intolerance and polyphagia.  Hematologic/Lymphatic: Negative for bleeding problem. Does not bruise/bleed easily.  Skin: Negative for flushing, nail changes, rash and suspicious lesions.  Musculoskeletal: Negative for arthritis, joint pain, muscle cramps, myalgias, neck pain and stiffness.  Gastrointestinal: Negative for abdominal pain, bowel incontinence, diarrhea and excessive appetite.  Genitourinary: Negative for decreased libido, genital sores and incomplete emptying.  Neurological: Negative for brief paralysis, focal weakness, headaches and loss of balance.  Psychiatric/Behavioral: Negative for altered mental status, depression and suicidal ideas.  Allergic/Immunologic: Negative for HIV exposure and persistent infections.    EKGs/Labs/Other Studies Reviewed:    The following studies were reviewed today:   EKG:  The ekg ordered today demonstrates   Recent Labs: 12/20/2022: BUN 20; Creatinine, Ser 0.81; Magnesium 2.4; Potassium 4.4; Sodium 141  Recent Lipid Panel No results  found for: "CHOL", "TRIG", "HDL", "CHOLHDL", "VLDL", "LDLCALC", "LDLDIRECT"  Physical Exam:    VS:  BP 136/88 (BP Location: Left Arm, Patient Position: Sitting, Cuff Size: Large)   Pulse 72   Ht '5\' 4"'$  (1.626 m)   Wt 83.9 kg   BMI 31.76 kg/m     Wt Readings from Last 3 Encounters:  12/20/22 83.9 kg  09/18/22 85.7 kg  10/27/19 81.6 kg     GEN: Well nourished, well developed in no acute distress HEENT: Normal NECK: No JVD; No carotid bruits LYMPHATICS: No lymphadenopathy CARDIAC: S1S2 noted,RRR, no murmurs, rubs, gallops RESPIRATORY:  Clear to auscultation without rales, wheezing or rhonchi  ABDOMEN: Soft, non-tender, non-distended, +bowel sounds, no guarding. EXTREMITIES: No edema, No cyanosis, no clubbing MUSCULOSKELETAL:  No deformity  SKIN: Warm and dry NEUROLOGIC:  Alert and oriented x 3, non-focal PSYCHIATRIC:  Normal affect, good insight  ASSESSMENT:    1. Chest pain, unspecified type   2. Palpitations   3. Obesity (BMI 30-39.9)    PLAN:    The symptoms chest pain is concerning, this patient does have intermediate risk for coronary artery disease and at this time I would like to pursue an ischemic evaluation in this patient.  Shared decision a coronary CTA at this time is appropriate.  I have discussed with the patient about the testing.  The patient has no IV contrast allergy and is agreeable to proceed with this test.    For her palpitations a zio monitor will be appropriate.  The patient understands the need to lose weight with diet and exercise. We have discussed specific strategies for this.  The patient is in agreement with the above plan. The patient left the office in stable condition.  The patient will follow up in 4 months or sooner if needed.   Medication Adjustments/Labs and Tests Ordered: Current medicines are reviewed at length with the patient today.  Concerns regarding medicines are outlined above.  Orders Placed This Encounter  Procedures   CT  CORONARY MORPH W/CTA COR W/SCORE W/CA W/CM &/OR WO/CM   Basic Metabolic Panel (BMET)   Magnesium   LONG TERM MONITOR (3-14 DAYS)   EKG 12-Lead   Meds ordered this encounter  Medications   metoprolol tartrate (LOPRESSOR) 100 MG tablet    Sig: Take 2 hours prior to CT    Dispense:  1 tablet    Refill:  0   predniSONE (DELTASONE) 50 MG tablet    Sig: Take 50 mg (one tablet) 13 hours before procedure. Take 50 mg (one tablet) 7 house before procedure. Take 50 mg (one tablet) before leaving home before procedure.    Dispense:  3 tablet    Refill:  0    Patient Instructions  Medication Instructions:  Your physician recommends that you continue on your current medications as directed. Please refer to the Current Medication list given to you today.  *If you need a refill on your cardiac medications before your next  appointment, please call your pharmacy*   Lab Work: Your physician recommends that you have labs drawn today. BMET, Mag If you have labs (blood work) drawn today and your tests are completely normal, you will receive your results only by: [place] (if you have MyChart) OR A paper copy in the mail If you have any lab test that is abnormal or we need to change your treatment, we will call you to review the results.   Testing/Procedures:   Your cardiac CT will be scheduled at one of the below locations:   [facility] [address] [place], [place] [address] [phone]  If scheduled at [facility], please arrive at the [facility] and Children's Entrance (Entrance C2) of [facility] 30 minutes prior to test start time. You can use the FREE valet parking offered at entrance C (encouraged to control the heart rate for the test)  Proceed to the [facility] Radiology Department (first floor) to check-in and test prep.  All radiology patients and guests should use entrance C2 at [facility], accessed from [facility], even  though the hospital's physical address listed is [address].     Please follow these instructions carefully (unless otherwise directed):   On the Night Before the Test: Be sure to Drink plenty of water. Do not consume any caffeinated/decaffeinated beverages or chocolate 12 hours prior to your test. Do not take any antihistamines 12 hours prior to your test. If the patient has contrast allergy: Patient will need a prescription for Prednisone and very clear instructions (as follows): Prednisone 50 mg - take 13 hours prior to test Take another Prednisone 50 mg 7 hours prior to test Take another Prednisone 50 mg 1 hour prior to test Take Benadryl 50 mg 1 hour prior to test Patient must complete all four doses of above prophylactic medications. Patient will need a ride after test due to Benadryl.  On the Day of the Test: Drink plenty of water until 1 hour prior to the test. Do not eat any food 1 hour prior to test. You may take your regular medications prior to the test.  Take metoprolol (Lopressor) two hours prior to test. HOLD Furosemide/Hydrochlorothiazide morning of the test. FEMALES- please wear underwire-free bra if available, avoid dresses & tight clothing       After the Test: Drink plenty of water. After receiving IV contrast, you may experience a mild flushed feeling. This is normal. On occasion, you may experience a mild rash up to 24 hours after the test. This is not dangerous. If this occurs, you can take Benadryl 25 mg and increase your fluid intake. If you experience trouble breathing, this can be serious. If it is severe call 911 IMMEDIATELY. If it is mild, please call our office. If you take any of these medications: Glipizide/Metformin, Avandament, Glucavance, please do not take 48 hours after completing test unless otherwise instructed.  We will call to schedule your test 2-4 weeks out understanding that some insurance companies will need an  authorization prior to the service being performed.   For non-scheduling related questions, please contact the cardiac imaging nurse navigator should you have any questions/concerns: Marchia Bond, Cardiac Imaging Nurse Navigator Gordy Clement, Cardiac Imaging Nurse Navigator [facility] Direct Office Dial: [phone]   For scheduling needs, including cancellations and rescheduling, please call [place], [phone].   ZIO XT- Long Term Monitor Instructions  Your physician has requested you wear  a ZIO patch monitor for 14 days.  This is a single patch monitor. [employer] supplies one patch monitor per enrollment. Additional stickers are not available. Please do not apply patch if you will be having a Nuclear Stress Test,  Echocardiogram, Cardiac CT, MRI, or Chest Xray during the period you would be wearing the  monitor. The patch cannot be worn during these tests. You cannot remove and re-apply the  ZIO XT patch monitor.  Your ZIO patch monitor will be mailed 3 day USPS to your address on file. It may take 3-5 days  to receive your monitor after you have been enrolled.  Once you have received your monitor, please review the enclosed instructions. Your monitor  has already been registered assigning a specific monitor serial # to you.  Billing and Patient Assistance Program Information  We have supplied [employer] with any of your insurance information on file for billing purposes. [employer] offers a sliding scale Patient Assistance Program for patients that do not have  insurance, or whose insurance does not completely cover the cost of the ZIO monitor.  You must apply for the Patient Assistance Program to qualify for this discounted rate.  To apply, please call [employer] at [phone], select option 4, select option 2, ask to apply for  Patient Assistance Program. Theodore Demark will ask your household income, and how many people  are in your household. They will quote  your out-of-pocket cost based on that information.  [employer] will also be able to set up a 52-month interest-free payment plan if needed.  Applying the monitor   Shave hair from upper left chest.  Hold abrader disc by orange tab. Rub abrader in 40 strokes over the upper left chest as  indicated in your monitor instructions.  Clean area with 4 enclosed alcohol pads. Let dry.  Apply patch as indicated in monitor instructions. Patch will be placed under collarbone on left  side of chest with arrow pointing upward.  Rub patch adhesive wings for 2 minutes. Remove white label marked "1". Remove the white  label marked "2". Rub patch adhesive wings for 2 additional minutes.  While looking in a mirror, press and release button in center of patch. A small green light will  flash 3-4 times. This will be your only indicator that the monitor has been turned on.  Do not shower for the first 24 hours. You may shower after the first 24 hours.  Press the button if you feel a symptom. You will hear a small click. Record Date, Time and  Symptom in the Patient Logbook.  When you are ready to remove the patch, follow instructions on the last 2 pages of Patient  Logbook. Stick patch monitor onto the last page of Patient Logbook.  Place Patient Logbook in the blue and white box. Use locking tab on box and tape box closed  securely. The blue and white box has prepaid postage on it. Please place it in the mailbox as  soon as possible. Your physician should have your test results approximately 7 days after the  [facility]  Call [place] [phone] you have questions regarding  your ZIO XT patch monitor. Call them immediately if you see an orange light blinking on your  monitor.  If your monitor falls off in less than 4 days, contact our Monitor department at [phone]  If your monitor becomes loose or falls off after 4 days call [employer] at  [phone] for  suggestions on securing your monitor  Follow-Up: At [facility], you and your health needs are our priority.  As part of our continuing mission to provide you with exceptional heart care, we have created designated Provider Care Teams.  These Care Teams include your primary Cardiologist (physician) and Advanced Practice Providers (APPs -  Physician Assistants and Nurse Practitioners) who all work together to provide you with the care you need, when you need it.  We recommend signing up for the patient portal called "MyChart".  Sign up information is provided on this After Visit Summary.  MyChart is used to connect with patients for Virtual Visits (Telemedicine).  Patients are able to view lab/test results, encounter notes, upcoming appointments, etc.  Non-urgent messages can be sent to your provider as well.   To learn more about what you can do with MyChart, go to [email or web address].    Your next appointment:   4 month(s)  Provider:   Berniece Salines, DO     Other Instructions     Adopting a Healthy Lifestyle.  Know what a healthy weight is for you (roughly BMI <25) and aim to maintain this   Aim for 7+ servings of fruits and vegetables daily   65-80+ fluid ounces of water or unsweet tea for healthy kidneys   Limit to max 1 drink of alcohol per day; avoid smoking/tobacco   Limit animal fats in diet for cholesterol and heart health - choose grass fed whenever available   Avoid highly processed foods, and foods high in saturated/trans fats   Aim for low stress - take time to unwind and care for your mental health   Aim for 150 min of moderate intensity exercise weekly for heart health, and weights twice weekly for bone health   Aim for 7-9 hours of sleep daily   When it comes to diets, agreement about the perfect plan isnt easy to find, even among the experts. Experts at the [facility] [facility] developed an idea known as the Healthy  Eating Plate. Just imagine a plate divided into logical, healthy portions.   The emphasis is on diet quality:   Load up on vegetables and fruits - one-half of your plate: Aim for color and variety, and remember that potatoes dont count.   Go for whole grains - one-quarter of your plate: Whole wheat, barley, wheat berries, quinoa, oats, brown rice, and foods made with them. If you want pasta, go with whole wheat pasta.   Protein power - one-quarter of your plate: Fish, chicken, beans, and nuts are all healthy, versatile protein sources. Limit red meat.   The diet, however, does go beyond the plate, offering a few other suggestions.   Use healthy plant oils, such as olive, canola, soy, corn, sunflower and peanut. Check the labels, and avoid partially hydrogenated oil, which have unhealthy trans fats.   If youre thirsty, drink water. Coffee and tea are good in moderation, but skip sugary drinks and limit milk and dairy products to one or two daily servings.   The type of carbohydrate in the diet is more important than the amount. Some sources of carbohydrates, such as vegetables, fruits, whole grains, and beans-are healthier than others.   Finally, stay active  Rolly Pancake, DO  12/23/2022 9:23 PM    [facility]

## 2022-12-20 NOTE — Patient Instructions (Signed)
Medication Instructions:  Your physician recommends that you continue on your current medications as directed. Please refer to the Current Medication list given to you today.  *If you need a refill on your cardiac medications before your next appointment, please call your pharmacy*   Lab Work: Your physician recommends that you have labs drawn today. BMET, Mag If you have labs (blood work) drawn today and your tests are completely normal, you will receive your results only by: [place] (if you have MyChart) OR A paper copy in the mail If you have any lab test that is abnormal or we need to change your treatment, we will call you to review the results.   Testing/Procedures:   Your cardiac CT will be scheduled at one of the below locations:   [facility] [address] [place], [place] [address] [phone]  If scheduled at [facility], please arrive at the [facility] and Children's Entrance (Entrance C2) of [facility] 30 minutes prior to test start time. You can use the FREE valet parking offered at entrance C (encouraged to control the heart rate for the test)  Proceed to the [facility] Radiology Department (first floor) to check-in and test prep.  All radiology patients and guests should use entrance C2 at [facility], accessed from [facility], even though the hospital's physical address listed is [address].     Please follow these instructions carefully (unless otherwise directed):   On the Night Before the Test: Be sure to Drink plenty of water. Do not consume any caffeinated/decaffeinated beverages or chocolate 12 hours prior to your test. Do not take any antihistamines 12 hours prior to your test. If the patient has contrast allergy: Patient will need a prescription for Prednisone and very clear instructions (as follows): Prednisone 50 mg - take 13 hours prior to test Take another Prednisone 50 mg 7  hours prior to test Take another Prednisone 50 mg 1 hour prior to test Take Benadryl 50 mg 1 hour prior to test Patient must complete all four doses of above prophylactic medications. Patient will need a ride after test due to Benadryl.  On the Day of the Test: Drink plenty of water until 1 hour prior to the test. Do not eat any food 1 hour prior to test. You may take your regular medications prior to the test.  Take metoprolol (Lopressor) two hours prior to test. HOLD Furosemide/Hydrochlorothiazide morning of the test. FEMALES- please wear underwire-free bra if available, avoid dresses & tight clothing       After the Test: Drink plenty of water. After receiving IV contrast, you may experience a mild flushed feeling. This is normal. On occasion, you may experience a mild rash up to 24 hours after the test. This is not dangerous. If this occurs, you can take Benadryl 25 mg and increase your fluid intake. If you experience trouble breathing, this can be serious. If it is severe call 911 IMMEDIATELY. If it is mild, please call our office. If you take any of these medications: Glipizide/Metformin, Avandament, Glucavance, please do not take 48 hours after completing test unless otherwise instructed.  We will call to schedule your test 2-4 weeks out understanding that some insurance companies will need an authorization prior to the service being performed.   For non-scheduling related questions, please contact the cardiac imaging nurse navigator should you have any questions/concerns: Marchia Bond, Cardiac Imaging Nurse Navigator Gordy Clement, Cardiac Imaging Nurse Navigator  [facility] Direct Office Dial: [phone]   For scheduling needs, including cancellations and rescheduling, please call [place], [phone].   ZIO XT- Long Term Monitor Instructions  Your physician has requested you wear a ZIO patch monitor for 14 days.  This is a single patch  monitor. [employer] supplies one patch monitor per enrollment. Additional stickers are not available. Please do not apply patch if you will be having a Nuclear Stress Test,  Echocardiogram, Cardiac CT, MRI, or Chest Xray during the period you would be wearing the  monitor. The patch cannot be worn during these tests. You cannot remove and re-apply the  ZIO XT patch monitor.  Your ZIO patch monitor will be mailed 3 day USPS to your address on file. It may take 3-5 days  to receive your monitor after you have been enrolled.  Once you have received your monitor, please review the enclosed instructions. Your monitor  has already been registered assigning a specific monitor serial # to you.  Billing and Patient Assistance Program Information  We have supplied [employer] with any of your insurance information on file for billing purposes. [employer] offers a sliding scale Patient Assistance Program for patients that do not have  insurance, or whose insurance does not completely cover the cost of the ZIO monitor.  You must apply for the Patient Assistance Program to qualify for this discounted rate.  To apply, please call [employer] at [phone], select option 4, select option 2, ask to apply for  Patient Assistance Program. Theodore Demark will ask your household income, and how many people  are in your household. They will quote your out-of-pocket cost based on that information.  [employer] will also be able to set up a 35-month interest-free payment plan if needed.  Applying the monitor   Shave hair from upper left chest.  Hold abrader disc by orange tab. Rub abrader in 40 strokes over the upper left chest as  indicated in your monitor instructions.  Clean area with 4 enclosed alcohol pads. Let dry.  Apply patch as indicated in monitor instructions. Patch will be placed under collarbone on left  side of chest with arrow pointing upward.  Rub patch adhesive wings for 2 minutes. Remove white label marked "1".  Remove the white  label marked "2". Rub patch adhesive wings for 2 additional minutes.  While looking in a mirror, press and release button in center of patch. A small green light will  flash 3-4 times. This will be your only indicator that the monitor has been turned on.  Do not shower for the first 24 hours. You may shower after the first 24 hours.  Press the button if you feel a symptom. You will hear a small click. Record Date, Time and  Symptom in the Patient Logbook.  When you are ready to remove the patch, follow instructions on the last 2 pages of Patient  Logbook. Stick patch monitor onto the last page of Patient Logbook.  Place Patient Logbook in the blue and white box. Use locking tab on box and tape box closed  securely. The blue and white box has prepaid postage on it. Please place it in the mailbox as  soon as possible. Your physician should have your test results approximately 7 days after the  monitor has been [facility]  Call [place] [phone] you have questions regarding  your ZIO XT patch monitor. Call them immediately if you see an orange  light blinking on your  monitor.  If your monitor falls off in less than 4 days, contact our Monitor department at [phone].  If your monitor becomes loose or falls off after 4 days call [employer] at [phone] for  suggestions on securing your monitor  Follow-Up: At [facility], you and your health needs are our priority.  As part of our continuing mission to provide you with exceptional heart care, we have created designated Provider Care Teams.  These Care Teams include your primary Cardiologist (physician) and Advanced Practice Providers (APPs -  Physician Assistants and Nurse Practitioners) who all work together to provide you with the care you need, when you need it.  We recommend signing up for the patient portal called "MyChart".  Sign up information is provided on  this After Visit Summary.  MyChart is used to connect with patients for Virtual Visits (Telemedicine).  Patients are able to view lab/test results, encounter notes, upcoming appointments, etc.  Non-urgent messages can be sent to your provider as well.   To learn more about what you can do with MyChart, go to [email or web address].    Your next appointment:   4 month(s)  Provider:   Berniece Salines, DO     Other Instructions

## 2022-12-20 NOTE — Progress Notes (Unsigned)
Enrolled patient for a 14 day Zio XT  monitor to be mailed to patients home  °

## 2022-12-23 DIAGNOSIS — R002 Palpitations: Secondary | ICD-10-CM | POA: Diagnosis not present

## 2023-01-03 ENCOUNTER — Encounter: Payer: Self-pay | Admitting: Orthopaedic Surgery

## 2023-01-03 ENCOUNTER — Ambulatory Visit (INDEPENDENT_AMBULATORY_CARE_PROVIDER_SITE_OTHER): Payer: BC Managed Care – PPO | Admitting: Orthopaedic Surgery

## 2023-01-03 DIAGNOSIS — G8929 Other chronic pain: Secondary | ICD-10-CM | POA: Diagnosis not present

## 2023-01-03 DIAGNOSIS — M1712 Unilateral primary osteoarthritis, left knee: Secondary | ICD-10-CM

## 2023-01-03 DIAGNOSIS — M5441 Lumbago with sciatica, right side: Secondary | ICD-10-CM

## 2023-01-03 DIAGNOSIS — M25562 Pain in left knee: Secondary | ICD-10-CM | POA: Diagnosis not present

## 2023-01-03 NOTE — Progress Notes (Signed)
The patient is a well-established patient of ours.  She comes in today with continued right hip pain but she really points to the sciatic area as a source of her pain.  She says by back in 2011 she did have epidural steroid injections.  She now sees Dr. Arnoldo Morale neurosurgeon in town for her neck who has recommended surgery.  I have seen her for her knees and she does have arthritic changes in both knees.  At her last visit we recommended hyaluronic acid but her insurance company would not cover this.  April talked her today about the possibility of ordering TriVisc for her left knee.  The patient states that she is fine with ordering that type of hyaluronic acid injection to see if this will help.  On examination of her hips, she has full and fluid range of motion of both hips and there is no pain over the trochanteric area on either side.  She does have pain over the SI joint and the lower lumbar spine to the right side and there is a sciatic component of her pain.  There is no weakness in the bilateral extremities or numbness and tingling in her feet.  I do feel it is reasonable to order an MRI of her lumbar spine to assess her right-sided sciatica to help determine what the next intervention could be.  We will also order hyaluronic acid with TriVisc for her left knee.  We will see her back in follow-up accordingly.

## 2023-01-04 ENCOUNTER — Telehealth: Payer: Self-pay

## 2023-01-04 ENCOUNTER — Other Ambulatory Visit: Payer: Self-pay

## 2023-01-04 DIAGNOSIS — G8929 Other chronic pain: Secondary | ICD-10-CM

## 2023-01-04 NOTE — Telephone Encounter (Signed)
Trivisc Left Knee (you may have already done this when you spoke with her in the office yesterday?)

## 2023-01-04 NOTE — Telephone Encounter (Signed)
Submitted for TriVisc, left knee online through [employer].  Talked with patient on Wednesday, 01/03/2023 and advised her that she will receive a call from [employer].  Patient voiced that she understand.

## 2023-01-08 ENCOUNTER — Telehealth: Payer: Self-pay | Admitting: Cardiology

## 2023-01-08 NOTE — Telephone Encounter (Signed)
Patient is returning call to discuss monitor results. 

## 2023-01-08 NOTE — Telephone Encounter (Signed)
Spoke to pt, see chart.  

## 2023-01-16 DIAGNOSIS — H6983 Other specified disorders of Eustachian tube, bilateral: Secondary | ICD-10-CM | POA: Diagnosis not present

## 2023-01-16 DIAGNOSIS — H9202 Otalgia, left ear: Secondary | ICD-10-CM | POA: Diagnosis not present

## 2023-01-16 DIAGNOSIS — M26602 Left temporomandibular joint disorder, unspecified: Secondary | ICD-10-CM | POA: Diagnosis not present

## 2023-01-18 ENCOUNTER — Ambulatory Visit
Admission: RE | Admit: 2023-01-18 | Discharge: 2023-01-18 | Disposition: A | Discharge status: Home / Self Care | Payer: BC Managed Care – PPO | Source: Ambulatory Visit | Attending: Orthopaedic Surgery | Admitting: Orthopaedic Surgery

## 2023-01-18 DIAGNOSIS — M5137 Other intervertebral disc degeneration, lumbosacral region: Secondary | ICD-10-CM | POA: Diagnosis not present

## 2023-01-18 DIAGNOSIS — G8929 Other chronic pain: Secondary | ICD-10-CM

## 2023-01-18 DIAGNOSIS — M47817 Spondylosis without myelopathy or radiculopathy, lumbosacral region: Secondary | ICD-10-CM | POA: Diagnosis not present

## 2023-01-22 ENCOUNTER — Ambulatory Visit (HOSPITAL_COMMUNITY): Payer: BC Managed Care – PPO

## 2023-01-26 ENCOUNTER — Telehealth (HOSPITAL_COMMUNITY): Payer: Self-pay | Admitting: *Deleted

## 2023-01-26 ENCOUNTER — Telehealth: Payer: Self-pay

## 2023-01-26 DIAGNOSIS — M1712 Unilateral primary osteoarthritis, left knee: Secondary | ICD-10-CM

## 2023-01-26 NOTE — Telephone Encounter (Signed)
Reaching out to patient to offer assistance regarding upcoming cardiac imaging study; pt verbalizes understanding of appt date/time, parking situation and where to check in, pre-test NPO status and medications ordered, and verified current allergies; name and call back number provided for further questions should they arise  Gordy Clement RN Navigator Cardiac Imaging [facility] [phone] office [phone] cell  Reviewed 13 hour prep and metoprolol tartrate with patient. She verbalized understanding of how to take the medications. She is aware to arrive at 10:30am.

## 2023-01-26 NOTE — Telephone Encounter (Signed)
Called and left a VM for patient to CB to schedule for gel injection with Dr. Ninfa Linden or Artis Delay.  Check referral tab

## 2023-01-30 ENCOUNTER — Ambulatory Visit (HOSPITAL_COMMUNITY)
Admission: RE | Admit: 2023-01-30 | Discharge: 2023-01-30 | Disposition: A | Discharge status: Home / Self Care | Payer: BC Managed Care – PPO | Source: Ambulatory Visit | Attending: Cardiology | Admitting: Cardiology

## 2023-01-30 DIAGNOSIS — R079 Chest pain, unspecified: Secondary | ICD-10-CM | POA: Insufficient documentation

## 2023-01-30 MED ORDER — NITROGLYCERIN 0.4 MG SL SUBL
SUBLINGUAL_TABLET | SUBLINGUAL | Status: AC
  Filled 2023-01-30: qty 2

## 2023-01-30 MED ORDER — NITROGLYCERIN 0.4 MG SL SUBL
0.8000 mg | SUBLINGUAL_TABLET | Freq: Once | SUBLINGUAL | Status: AC
  Administered 2023-01-30: 0.8 mg via SUBLINGUAL

## 2023-01-30 MED ORDER — IOHEXOL 350 MG/ML SOLN
100.0000 mL | Freq: Once | INTRAVENOUS | Status: AC | PRN
  Administered 2023-01-30: 100 mL via INTRAVENOUS

## 2023-01-31 ENCOUNTER — Encounter: Payer: Self-pay | Admitting: Orthopaedic Surgery

## 2023-01-31 ENCOUNTER — Ambulatory Visit (INDEPENDENT_AMBULATORY_CARE_PROVIDER_SITE_OTHER): Payer: BC Managed Care – PPO | Admitting: Orthopaedic Surgery

## 2023-01-31 DIAGNOSIS — M1712 Unilateral primary osteoarthritis, left knee: Secondary | ICD-10-CM

## 2023-01-31 MED ORDER — SODIUM HYALURONATE (VISCOSUP) 25 MG/2.5ML IX SOSY
25.0000 mg | PREFILLED_SYRINGE | INTRA_ARTICULAR | Status: AC | PRN
  Administered 2023-01-31: 25 mg via INTRA_ARTICULAR

## 2023-01-31 NOTE — Progress Notes (Signed)
The patient comes in today for scheduled hyaluronic acid injection her left knee with TriVisc to treat the pain from osteoarthritis.  This is her first of 3 TriVisc injections.  She also is here to review MRI of her lumbar spine.  She has been having low back pain.  She has been on prednisone for few days and she is is really help with her back.  The MRI of her lumbar spine does show moderate to severe facet arthritis at L4-L5 and both right and left facet joints.  That seems to correlate with the source of her pain.  There is no nerve compression and only mild arthritic changes throughout her back otherwise.  We talked about her trying core strengthening exercises and if that does not help we could send her to Dr. Ernestina Patches for facet joint injections.  I then placed injection #1 of a series of 3 TriVisc injections in her left knee which she tolerated well.  There was no effusion of her left knee.  We will see her back next week for the continued series of TriVisc injections.      Procedure Note  Patient: Christina Mcdonald             Date of Birth: 03-Aug-1966           MRN: [id number]             Visit Date: 01/31/2023  Procedures: Visit Diagnoses:  1. Unilateral primary osteoarthritis, left knee     Large Joint Inj: L knee on 01/31/2023 10:36 AM Indications: diagnostic evaluation and pain Details: 22 G 1.5 in needle, superolateral approach  Arthrogram: No  Medications: 25 mg Sodium Hyaluronate (Viscosup) 25 MG/2.5ML Outcome: tolerated well, no immediate complications Procedure, treatment alternatives, risks and benefits explained, specific risks discussed. Consent was given by the patient. Immediately prior to procedure a time out was called to verify the correct patient, procedure, equipment, support staff and site/side marked as required. Patient was prepped and draped in the usual sterile fashion.    Lot number: [id number], expiration date: 07/03/2025

## 2023-02-07 ENCOUNTER — Ambulatory Visit (INDEPENDENT_AMBULATORY_CARE_PROVIDER_SITE_OTHER): Payer: BC Managed Care – PPO | Admitting: Orthopaedic Surgery

## 2023-02-07 ENCOUNTER — Encounter: Payer: Self-pay | Admitting: Orthopaedic Surgery

## 2023-02-07 ENCOUNTER — Telehealth: Payer: Self-pay | Admitting: Cardiology

## 2023-02-07 ENCOUNTER — Other Ambulatory Visit: Payer: Self-pay

## 2023-02-07 DIAGNOSIS — M1712 Unilateral primary osteoarthritis, left knee: Secondary | ICD-10-CM

## 2023-02-07 MED ORDER — SODIUM HYALURONATE (VISCOSUP) 25 MG/2.5ML IX SOSY
25.0000 mg | PREFILLED_SYRINGE | INTRA_ARTICULAR | Status: AC | PRN
  Administered 2023-02-07: 25 mg via INTRA_ARTICULAR

## 2023-02-07 MED ORDER — ROSUVASTATIN CALCIUM 5 MG PO TABS: 5.0000 mg | ORAL_TABLET | Freq: Every day | ORAL | 3 refills | Status: DC

## 2023-02-07 NOTE — Progress Notes (Signed)
The patient is here for injection #2 of a series of 3 hyaluronic acid injections in her left knee with TriVisc to treat the pain from osteoarthritis.  She has had no adverse reaction to the first injection.  I did easily place the second injection in her left knee today without difficulty but it was a little more painful to her.  We will see her back next week for injection #3.     Procedure Note  Patient: Christina Mcdonald             Date of Birth: [birth year]           MRN: [id number]             Visit Date: 02/07/2023  Procedures: Visit Diagnoses:  1. Unilateral primary osteoarthritis, left knee     Large Joint Inj: L knee on 02/07/2023 10:39 AM Indications: diagnostic evaluation and pain Details: 22 G 1.5 in needle, superolateral approach  Arthrogram: No  Medications: 25 mg Sodium Hyaluronate (Viscosup) 25 MG/2.5ML Outcome: tolerated well, no immediate complications Procedure, treatment alternatives, risks and benefits explained, specific risks discussed. Consent was given by the patient. Immediately prior to procedure a time out was called to verify the correct patient, procedure, equipment, support staff and site/side marked as required. Patient was prepped and draped in the usual sterile fashion.    Lot number [id number] Expiration date: 07/03/2025

## 2023-02-07 NOTE — Telephone Encounter (Signed)
Patient is returning call in regards to results.

## 2023-02-07 NOTE — Telephone Encounter (Signed)
Called pt to go over her labs and Dr. Terrial Rhodes recommendations. She verbalized understanding, prescription sent to pharmacy.

## 2023-02-14 ENCOUNTER — Ambulatory Visit (INDEPENDENT_AMBULATORY_CARE_PROVIDER_SITE_OTHER): Payer: BC Managed Care – PPO | Admitting: Physician Assistant

## 2023-02-14 ENCOUNTER — Encounter: Payer: Self-pay | Admitting: Physician Assistant

## 2023-02-14 DIAGNOSIS — M1712 Unilateral primary osteoarthritis, left knee: Secondary | ICD-10-CM | POA: Diagnosis not present

## 2023-02-14 MED ORDER — SODIUM HYALURONATE (VISCOSUP) 25 MG/2.5ML IX SOSY
25.0000 mg | PREFILLED_SYRINGE | INTRA_ARTICULAR | Status: AC | PRN
  Administered 2023-02-14: 25 mg via INTRA_ARTICULAR

## 2023-02-14 MED ORDER — LIDOCAINE HCL 1 % IJ SOLN
3.0000 mL | INTRAMUSCULAR | Status: AC | PRN
  Administered 2023-02-14: 3 mL

## 2023-02-14 NOTE — Progress Notes (Signed)
   Procedure Note  Patient: Christina Mcdonald             Date of Birth: June 22, 1966           MRN: [id number]             Visit Date: 02/14/2023 HPI: Patient comes in today for her third TriVisc injection.  She states the first injection did well the second injection was painful.  She has had no new injuries.  She has no scheduled surgery on the left knee in the next 6 months.  Known osteoarthritis left knee.  Procedures: Visit Diagnoses:  1. Unilateral primary osteoarthritis, left knee     Large Joint Inj: L knee on 02/14/2023 11:56 AM Indications: pain Details: 22 G 1.5 in needle, superolateral approach  Arthrogram: No  Medications: 25 mg Sodium Hyaluronate (Viscosup) 25 MG/2.5ML; 3 mL lidocaine 1 % Outcome: tolerated well, no immediate complications Procedure, treatment alternatives, risks and benefits explained, specific risks discussed. Consent was given by the patient. Immediately prior to procedure a time out was called to verify the correct patient, procedure, equipment, support staff and site/side marked as required. Patient was prepped and draped in the usual sterile fashion.     Plan: She knows to wait 6 months between injections.  Follow-up as needed.  Questions were encouraged and answered.

## 2023-02-16 DIAGNOSIS — E890 Postprocedural hypothyroidism: Secondary | ICD-10-CM | POA: Diagnosis not present

## 2023-03-16 ENCOUNTER — Other Ambulatory Visit: Payer: Self-pay

## 2023-03-16 MED ORDER — ROSUVASTATIN CALCIUM 5 MG PO TABS: 5.0000 mg | ORAL_TABLET | Freq: Every day | ORAL | 3 refills | Status: AC

## 2023-03-20 DIAGNOSIS — H9203 Otalgia, bilateral: Secondary | ICD-10-CM | POA: Diagnosis not present

## 2023-04-06 DIAGNOSIS — N3010 Interstitial cystitis (chronic) without hematuria: Secondary | ICD-10-CM | POA: Diagnosis not present

## 2023-04-20 ENCOUNTER — Ambulatory Visit: Payer: BC Managed Care – PPO | Attending: Cardiology | Admitting: Cardiology

## 2023-04-20 ENCOUNTER — Encounter: Payer: Self-pay | Admitting: Cardiology

## 2023-04-20 VITALS — BP 122/72 | HR 73 | Ht 64.0 in | Wt 186.4 lb

## 2023-04-20 DIAGNOSIS — I2510 Atherosclerotic heart disease of native coronary artery without angina pectoris: Secondary | ICD-10-CM | POA: Diagnosis not present

## 2023-04-20 NOTE — Patient Instructions (Signed)
Medication Instructions:  Your physician recommends that you continue on your current medications as directed. Please refer to the Current Medication list given to you today.  *If you need a refill on your cardiac medications before your next appointment, please call your pharmacy*   Lab Work: None   Testing/Procedures: None   Follow-Up: At [facility], you and your health needs are our priority.  As part of our continuing mission to provide you with exceptional heart care, we have created designated Provider Care Teams.  These Care Teams include your primary Cardiologist (physician) and Advanced Practice Providers (APPs -  Physician Assistants and Nurse Practitioners) who all work together to provide you with the care you need, when you need it.   Your next appointment:   1 year(s)  Provider:   Kardie Tobb, DO   

## 2023-04-20 NOTE — Progress Notes (Signed)
Cardiology Office Note:    Date:  04/20/2023   ID:  Kharis, Schuneman [birth year], MRN [id number]  PCP:  Jim Like, NP  Cardiologist:  Thomasene Ripple, DO  Electrophysiologist:  None   Referring MD: Jim Like, NP   No chief complaint on file.   History of Present Illness:    Christina Mcdonald is a 57 y.o. female with a hx of minimal coronary artery disease seen on coronary CTA in February 2024 ZIO monitor was normal.  She offers no complaints at this time.  She had questions about PAD and her family history.  She tells me previously she had testing done which showed that she did not have PAD.  She does not have any symptoms of leg cramps or pain she just wanted to talk about it.   Past Medical History:  Diagnosis Date   Cancer of thyroid [facility])     Past Surgical History:  Procedure Laterality Date   ABDOMINAL HYSTERECTOMY     CHOLECYSTECTOMY     THYROID SURGERY     TONSILLECTOMY      Current Medications: Current Meds  Medication Sig   benazepril-hydrochlorthiazide (LOTENSIN HCT) 10-12.5 MG tablet Take 1 tablet by mouth daily.   cetirizine (ZYRTEC) 10 MG tablet Take 10 mg by mouth daily.   levothyroxine (SYNTHROID) 88 MCG tablet Take 88 mcg by mouth daily before breakfast. Except take half tablet on Sunday.   montelukast (SINGULAIR) 10 MG tablet Take 10 mg by mouth at bedtime.   rosuvastatin (CRESTOR) 5 MG tablet Take 1 tablet (5 mg total) by mouth daily.   Vitamin D, Ergocalciferol, (DRISDOL) 1.25 MG (50000 UNIT) CAPS capsule Take 50,000 Units by mouth. EVERY OTHER WEEK     Allergies:   Bee venom, Ivp dye [iodinated contrast media], Keflex [cephalexin], and Gadolinium   Social History   Socioeconomic History   Marital status: Married    Spouse name: Not on file   Number of children: Not on file   Years of education: Not on file   Highest education level: Not on file  Occupational History   Not on file  Tobacco Use   Smoking status: Never    Smokeless tobacco: Never  Vaping Use   Vaping Use: Never used  Substance and Sexual Activity   Alcohol use: Never   Drug use: Never   Sexual activity: Not on file  Other Topics Concern   Not on file  Social History Narrative   Not on file   Social Determinants of Health   Financial Resource Strain: Not on file  Food Insecurity: Not on file  Transportation Needs: Not on file  Physical Activity: Not on file  Stress: Not on file  Social Connections: Not on file     Family History: The patient's family history is not on file.  ROS:   Review of Systems  Constitution: Negative for decreased appetite, fever and weight gain.  HENT: Negative for congestion, ear discharge, hoarse voice and sore throat.   Eyes: Negative for discharge, redness, vision loss in right eye and visual halos.  Cardiovascular: Negative for chest pain, dyspnea on exertion, leg swelling, orthopnea and palpitations.  Respiratory: Negative for cough, hemoptysis, shortness of breath and snoring.   Endocrine: Negative for heat intolerance and polyphagia.  Hematologic/Lymphatic: Negative for bleeding problem. Does not bruise/bleed easily.  Skin: Negative for flushing, nail changes, rash and suspicious lesions.  Musculoskeletal: Negative for arthritis, joint pain, muscle cramps, myalgias, neck pain and  stiffness.  Gastrointestinal: Negative for abdominal pain, bowel incontinence, diarrhea and excessive appetite.  Genitourinary: Negative for decreased libido, genital sores and incomplete emptying.  Neurological: Negative for brief paralysis, focal weakness, headaches and loss of balance.  Psychiatric/Behavioral: Negative for altered mental status, depression and suicidal ideas.  Allergic/Immunologic: Negative for HIV exposure and persistent infections.    EKGs/Labs/Other Studies Reviewed:    The following studies were reviewed today:   EKG:  The ekg ordered today demonstrates   Recent Labs: 12/20/2022: BUN 20;  Creatinine, Ser 0.81; Magnesium 2.4; Potassium 4.4; Sodium 141  Recent Lipid Panel No results found for: "CHOL", "TRIG", "HDL", "CHOLHDL", "VLDL", "LDLCALC", "LDLDIRECT"  Physical Exam:    VS:  BP 122/72 (BP Location: Left Arm, Patient Position: Sitting, Cuff Size: Normal)   Pulse 73   Ht 5\' 4"  (1.626 m)   Wt 186 lb 6.4 oz (84.6 kg)   SpO2 97%   BMI 32.00 kg/m     Wt Readings from Last 3 Encounters:  04/20/23 186 lb 6.4 oz (84.6 kg)  12/20/22 185 lb (83.9 kg)  09/18/22 189 lb (85.7 kg)     GEN: Well nourished, well developed in no acute distress HEENT: Normal NECK: No JVD; No carotid bruits LYMPHATICS: No lymphadenopathy CARDIAC: S1S2 noted,RRR, no murmurs, rubs, gallops RESPIRATORY:  Clear to auscultation without rales, wheezing or rhonchi  ABDOMEN: Soft, non-tender, non-distended, +bowel sounds, no guarding. EXTREMITIES: No edema, No cyanosis, no clubbing MUSCULOSKELETAL:  No deformity  SKIN: Warm and dry NEUROLOGIC:  Alert and oriented x 3, non-focal PSYCHIATRIC:  Normal affect, good insight  ASSESSMENT:    1. Coronary artery disease involving native heart, unspecified vessel or lesion type, unspecified whether angina present     PLAN:     Educated patient will need to have minimal coronary artery disease.  Will keep current dose of Crestor.  She has blood work coming up with her PCP which I have asked her to send me information LDL goal less than 70.  Prediabetes hemoglobin A1c will be done with PCP as well.  The patient understands the need to lose weight with diet and exercise. We have discussed specific strategies for this.  The patient is in agreement with the above plan. The patient left the office in stable condition.  The patient will follow up in 4 months or sooner if needed.   Medication Adjustments/Labs and Tests Ordered: Current medicines are reviewed at length with the patient today.  Concerns regarding medicines are outlined above.  No orders of  the defined types were placed in this encounter.  No orders of the defined types were placed in this encounter.   Patient Instructions  Medication Instructions:  Your physician recommends that you continue on your current medications as directed. Please refer to the Current Medication list given to you today.  *If you need a refill on your cardiac medications before your next appointment, please call your pharmacy*   Lab Work: None   Testing/Procedures: None   Follow-Up: At [facility], you and your health needs are our priority.  As part of our continuing mission to provide you with exceptional heart care, we have created designated Provider Care Teams.  These Care Teams include your primary Cardiologist (physician) and Advanced Practice Providers (APPs -  Physician Assistants and Nurse Practitioners) who all work together to provide you with the care you need, when you need it.  Your next appointment:   1 year(s)  Provider:   Thomasene Ripple, DO  Adopting a Healthy Lifestyle.  Know what a healthy weight is for you (roughly BMI <25) and aim to maintain this   Aim for 7+ servings of fruits and vegetables daily   65-80+ fluid ounces of water or unsweet tea for healthy kidneys   Limit to max 1 drink of alcohol per day; avoid smoking/tobacco   Limit animal fats in diet for cholesterol and heart health - choose grass fed whenever available   Avoid highly processed foods, and foods high in saturated/trans fats   Aim for low stress - take time to unwind and care for your mental health   Aim for 150 min of moderate intensity exercise weekly for heart health, and weights twice weekly for bone health   Aim for 7-9 hours of sleep daily   When it comes to diets, agreement about the perfect plan isnt easy to find, even among the experts. Experts at the [facility] of [employer] developed an idea known as the Healthy Eating Plate. Just imagine a plate divided into  logical, healthy portions.   The emphasis is on diet quality:   Load up on vegetables and fruits - one-half of your plate: Aim for color and variety, and remember that potatoes dont count.   Go for whole grains - one-quarter of your plate: Whole wheat, barley, wheat berries, quinoa, oats, brown rice, and foods made with them. If you want pasta, go with whole wheat pasta.   Protein power - one-quarter of your plate: Fish, chicken, beans, and nuts are all healthy, versatile protein sources. Limit red meat.   The diet, however, does go beyond the plate, offering a few other suggestions.   Use healthy plant oils, such as olive, canola, soy, corn, sunflower and peanut. Check the labels, and avoid partially hydrogenated oil, which have unhealthy trans fats.   If youre thirsty, drink water. Coffee and tea are good in moderation, but skip sugary drinks and limit milk and dairy products to one or two daily servings.   The type of carbohydrate in the diet is more important than the amount. Some sources of carbohydrates, such as vegetables, fruits, whole grains, and beans-are healthier than others.   Finally, stay active  Signed, Thomasene Ripple, DO  04/20/2023 10:03 PM    [facility]

## 2023-04-27 DIAGNOSIS — N3010 Interstitial cystitis (chronic) without hematuria: Secondary | ICD-10-CM | POA: Diagnosis not present

## 2023-05-01 DIAGNOSIS — E890 Postprocedural hypothyroidism: Secondary | ICD-10-CM | POA: Diagnosis not present

## 2023-05-03 DIAGNOSIS — E785 Hyperlipidemia, unspecified: Secondary | ICD-10-CM | POA: Diagnosis not present

## 2023-05-03 DIAGNOSIS — R7303 Prediabetes: Secondary | ICD-10-CM | POA: Diagnosis not present

## 2023-05-03 DIAGNOSIS — E559 Vitamin D deficiency, unspecified: Secondary | ICD-10-CM | POA: Diagnosis not present

## 2023-05-03 DIAGNOSIS — I10 Essential (primary) hypertension: Secondary | ICD-10-CM | POA: Diagnosis not present

## 2023-05-03 DIAGNOSIS — H6693 Otitis media, unspecified, bilateral: Secondary | ICD-10-CM | POA: Diagnosis not present

## 2023-05-03 LAB — LAB REPORT - SCANNED: EGFR: 102

## 2023-05-04 DIAGNOSIS — N3010 Interstitial cystitis (chronic) without hematuria: Secondary | ICD-10-CM | POA: Diagnosis not present

## 2023-05-04 DIAGNOSIS — Z85850 Personal history of malignant neoplasm of thyroid: Secondary | ICD-10-CM | POA: Diagnosis not present

## 2023-05-04 DIAGNOSIS — R221 Localized swelling, mass and lump, neck: Secondary | ICD-10-CM | POA: Diagnosis not present

## 2023-05-04 DIAGNOSIS — E041 Nontoxic single thyroid nodule: Secondary | ICD-10-CM | POA: Diagnosis not present

## 2023-05-11 DIAGNOSIS — N3010 Interstitial cystitis (chronic) without hematuria: Secondary | ICD-10-CM | POA: Diagnosis not present

## 2023-05-18 DIAGNOSIS — N3010 Interstitial cystitis (chronic) without hematuria: Secondary | ICD-10-CM | POA: Diagnosis not present

## 2023-05-23 DIAGNOSIS — H6693 Otitis media, unspecified, bilateral: Secondary | ICD-10-CM | POA: Diagnosis not present

## 2023-05-23 DIAGNOSIS — J329 Chronic sinusitis, unspecified: Secondary | ICD-10-CM | POA: Diagnosis not present

## 2023-05-28 DIAGNOSIS — Z85850 Personal history of malignant neoplasm of thyroid: Secondary | ICD-10-CM | POA: Diagnosis not present

## 2023-05-28 DIAGNOSIS — J342 Deviated nasal septum: Secondary | ICD-10-CM | POA: Diagnosis not present

## 2023-05-28 DIAGNOSIS — J329 Chronic sinusitis, unspecified: Secondary | ICD-10-CM | POA: Diagnosis not present

## 2023-06-15 DIAGNOSIS — N3010 Interstitial cystitis (chronic) without hematuria: Secondary | ICD-10-CM | POA: Diagnosis not present

## 2023-07-18 DIAGNOSIS — L820 Inflamed seborrheic keratosis: Secondary | ICD-10-CM | POA: Diagnosis not present

## 2023-07-18 DIAGNOSIS — L578 Other skin changes due to chronic exposure to nonionizing radiation: Secondary | ICD-10-CM | POA: Diagnosis not present

## 2023-07-18 DIAGNOSIS — L821 Other seborrheic keratosis: Secondary | ICD-10-CM | POA: Diagnosis not present

## 2023-07-18 DIAGNOSIS — D225 Melanocytic nevi of trunk: Secondary | ICD-10-CM | POA: Diagnosis not present

## 2023-07-24 DIAGNOSIS — F419 Anxiety disorder, unspecified: Secondary | ICD-10-CM | POA: Diagnosis not present

## 2023-07-24 DIAGNOSIS — Z6831 Body mass index (BMI) 31.0-31.9, adult: Secondary | ICD-10-CM | POA: Diagnosis not present

## 2023-08-03 DIAGNOSIS — Z1231 Encounter for screening mammogram for malignant neoplasm of breast: Secondary | ICD-10-CM | POA: Diagnosis not present

## 2023-08-30 DIAGNOSIS — F419 Anxiety disorder, unspecified: Secondary | ICD-10-CM | POA: Diagnosis not present

## 2023-08-30 DIAGNOSIS — I10 Essential (primary) hypertension: Secondary | ICD-10-CM | POA: Diagnosis not present

## 2023-08-30 DIAGNOSIS — E559 Vitamin D deficiency, unspecified: Secondary | ICD-10-CM | POA: Diagnosis not present

## 2023-08-30 DIAGNOSIS — E785 Hyperlipidemia, unspecified: Secondary | ICD-10-CM | POA: Diagnosis not present

## 2023-08-30 DIAGNOSIS — Z85850 Personal history of malignant neoplasm of thyroid: Secondary | ICD-10-CM | POA: Diagnosis not present

## 2023-09-21 DIAGNOSIS — R300 Dysuria: Secondary | ICD-10-CM | POA: Diagnosis not present

## 2023-09-21 DIAGNOSIS — N3010 Interstitial cystitis (chronic) without hematuria: Secondary | ICD-10-CM | POA: Diagnosis not present

## 2023-11-23 DIAGNOSIS — Z85850 Personal history of malignant neoplasm of thyroid: Secondary | ICD-10-CM | POA: Diagnosis not present

## 2023-11-23 DIAGNOSIS — E890 Postprocedural hypothyroidism: Secondary | ICD-10-CM | POA: Diagnosis not present

## 2023-12-06 DIAGNOSIS — R5383 Other fatigue: Secondary | ICD-10-CM | POA: Diagnosis not present

## 2023-12-06 DIAGNOSIS — N390 Urinary tract infection, site not specified: Secondary | ICD-10-CM | POA: Diagnosis not present

## 2023-12-06 DIAGNOSIS — F419 Anxiety disorder, unspecified: Secondary | ICD-10-CM | POA: Diagnosis not present

## 2023-12-12 DIAGNOSIS — L03211 Cellulitis of face: Secondary | ICD-10-CM | POA: Diagnosis not present

## 2023-12-12 DIAGNOSIS — R202 Paresthesia of skin: Secondary | ICD-10-CM | POA: Diagnosis not present

## 2023-12-12 DIAGNOSIS — K130 Diseases of lips: Secondary | ICD-10-CM | POA: Diagnosis not present

## 2023-12-28 DIAGNOSIS — N3010 Interstitial cystitis (chronic) without hematuria: Secondary | ICD-10-CM | POA: Diagnosis not present

## 2024-03-10 DIAGNOSIS — Z85850 Personal history of malignant neoplasm of thyroid: Secondary | ICD-10-CM | POA: Diagnosis not present

## 2024-03-10 DIAGNOSIS — E785 Hyperlipidemia, unspecified: Secondary | ICD-10-CM | POA: Diagnosis not present

## 2024-03-10 DIAGNOSIS — I10 Essential (primary) hypertension: Secondary | ICD-10-CM | POA: Diagnosis not present

## 2024-03-10 DIAGNOSIS — E559 Vitamin D deficiency, unspecified: Secondary | ICD-10-CM | POA: Diagnosis not present

## 2024-03-10 DIAGNOSIS — Z1331 Encounter for screening for depression: Secondary | ICD-10-CM | POA: Diagnosis not present

## 2024-03-10 DIAGNOSIS — F32A Depression, unspecified: Secondary | ICD-10-CM | POA: Diagnosis not present

## 2024-03-10 DIAGNOSIS — F419 Anxiety disorder, unspecified: Secondary | ICD-10-CM | POA: Diagnosis not present

## 2024-04-18 DIAGNOSIS — N3010 Interstitial cystitis (chronic) without hematuria: Secondary | ICD-10-CM | POA: Diagnosis not present

## 2024-05-23 DIAGNOSIS — E890 Postprocedural hypothyroidism: Secondary | ICD-10-CM | POA: Diagnosis not present

## 2024-05-29 DIAGNOSIS — L501 Idiopathic urticaria: Secondary | ICD-10-CM | POA: Diagnosis not present

## 2024-06-10 DIAGNOSIS — R1011 Right upper quadrant pain: Secondary | ICD-10-CM | POA: Diagnosis not present

## 2024-06-10 DIAGNOSIS — Z800 Family history of malignant neoplasm of digestive organs: Secondary | ICD-10-CM | POA: Diagnosis not present

## 2024-06-10 DIAGNOSIS — K219 Gastro-esophageal reflux disease without esophagitis: Secondary | ICD-10-CM | POA: Diagnosis not present

## 2024-06-10 DIAGNOSIS — K862 Cyst of pancreas: Secondary | ICD-10-CM | POA: Diagnosis not present

## 2024-06-20 DIAGNOSIS — K862 Cyst of pancreas: Secondary | ICD-10-CM | POA: Diagnosis not present

## 2024-06-20 DIAGNOSIS — Z800 Family history of malignant neoplasm of digestive organs: Secondary | ICD-10-CM | POA: Diagnosis not present

## 2024-06-20 DIAGNOSIS — I10 Essential (primary) hypertension: Secondary | ICD-10-CM | POA: Diagnosis not present

## 2024-06-20 DIAGNOSIS — K2970 Gastritis, unspecified, without bleeding: Secondary | ICD-10-CM | POA: Diagnosis not present

## 2024-06-20 DIAGNOSIS — K449 Diaphragmatic hernia without obstruction or gangrene: Secondary | ICD-10-CM | POA: Diagnosis not present

## 2024-06-20 DIAGNOSIS — K219 Gastro-esophageal reflux disease without esophagitis: Secondary | ICD-10-CM | POA: Diagnosis not present

## 2024-06-20 DIAGNOSIS — Z79899 Other long term (current) drug therapy: Secondary | ICD-10-CM | POA: Diagnosis not present

## 2024-06-20 DIAGNOSIS — R1011 Right upper quadrant pain: Secondary | ICD-10-CM | POA: Diagnosis not present

## 2024-06-20 DIAGNOSIS — K3189 Other diseases of stomach and duodenum: Secondary | ICD-10-CM | POA: Diagnosis not present

## 2024-06-20 DIAGNOSIS — K7689 Other specified diseases of liver: Secondary | ICD-10-CM | POA: Diagnosis not present

## 2024-06-23 ENCOUNTER — Other Ambulatory Visit: Payer: Self-pay | Admitting: Cardiology

## 2024-07-30 DIAGNOSIS — L501 Idiopathic urticaria: Secondary | ICD-10-CM | POA: Diagnosis not present

## 2024-07-30 DIAGNOSIS — L578 Other skin changes due to chronic exposure to nonionizing radiation: Secondary | ICD-10-CM | POA: Diagnosis not present

## 2024-07-30 DIAGNOSIS — L638 Other alopecia areata: Secondary | ICD-10-CM | POA: Diagnosis not present

## 2024-07-30 DIAGNOSIS — D485 Neoplasm of uncertain behavior of skin: Secondary | ICD-10-CM | POA: Diagnosis not present

## 2024-08-08 DIAGNOSIS — N3010 Interstitial cystitis (chronic) without hematuria: Secondary | ICD-10-CM | POA: Diagnosis not present

## 2024-08-20 DIAGNOSIS — C4441 Basal cell carcinoma of skin of scalp and neck: Secondary | ICD-10-CM | POA: Diagnosis not present

## 2024-09-09 DIAGNOSIS — E785 Hyperlipidemia, unspecified: Secondary | ICD-10-CM | POA: Diagnosis not present

## 2024-09-09 DIAGNOSIS — I10 Essential (primary) hypertension: Secondary | ICD-10-CM | POA: Diagnosis not present

## 2024-09-09 DIAGNOSIS — Z85850 Personal history of malignant neoplasm of thyroid: Secondary | ICD-10-CM | POA: Diagnosis not present

## 2024-09-09 DIAGNOSIS — E559 Vitamin D deficiency, unspecified: Secondary | ICD-10-CM | POA: Diagnosis not present

## 2024-09-09 DIAGNOSIS — J45909 Unspecified asthma, uncomplicated: Secondary | ICD-10-CM | POA: Diagnosis not present

## 2024-10-18 DIAGNOSIS — H81399 Other peripheral vertigo, unspecified ear: Secondary | ICD-10-CM | POA: Diagnosis not present

## 2024-10-18 DIAGNOSIS — R0789 Other chest pain: Secondary | ICD-10-CM | POA: Diagnosis not present

## 2024-10-18 DIAGNOSIS — I10 Essential (primary) hypertension: Secondary | ICD-10-CM | POA: Diagnosis not present

## 2024-10-18 DIAGNOSIS — R202 Paresthesia of skin: Secondary | ICD-10-CM | POA: Diagnosis not present

## 2024-10-18 DIAGNOSIS — R079 Chest pain, unspecified: Secondary | ICD-10-CM | POA: Diagnosis not present

## 2024-10-24 DIAGNOSIS — T50905A Adverse effect of unspecified drugs, medicaments and biological substances, initial encounter: Secondary | ICD-10-CM | POA: Diagnosis not present

## 2024-10-24 DIAGNOSIS — R079 Chest pain, unspecified: Secondary | ICD-10-CM | POA: Diagnosis not present

## 2024-10-24 DIAGNOSIS — Z79899 Other long term (current) drug therapy: Secondary | ICD-10-CM | POA: Diagnosis not present

## 2024-10-24 DIAGNOSIS — I10 Essential (primary) hypertension: Secondary | ICD-10-CM | POA: Diagnosis not present

## 2024-11-21 DIAGNOSIS — C73 Malignant neoplasm of thyroid gland: Secondary | ICD-10-CM | POA: Diagnosis not present

## 2024-11-21 DIAGNOSIS — E890 Postprocedural hypothyroidism: Secondary | ICD-10-CM | POA: Diagnosis not present
